# Patient Record
Sex: Female | Born: 1995
Health system: Southern US, Community
[De-identification: ages and names within clinical notes are randomized; demographics above are authoritative.]

## PROBLEM LIST (undated history)

## (undated) DIAGNOSIS — T7840XA Allergy, unspecified, initial encounter: Secondary | ICD-10-CM

## (undated) DIAGNOSIS — H6690 Otitis media, unspecified, unspecified ear: Secondary | ICD-10-CM

## (undated) DIAGNOSIS — K589 Irritable bowel syndrome without diarrhea: Secondary | ICD-10-CM

## (undated) DIAGNOSIS — G43909 Migraine, unspecified, not intractable, without status migrainosus: Secondary | ICD-10-CM

## (undated) HISTORY — DX: Irritable bowel syndrome, unspecified: K58.9

## (undated) HISTORY — DX: Allergy, unspecified, initial encounter: T78.40XA

## (undated) HISTORY — DX: Migraine, unspecified, not intractable, without status migrainosus: G43.909

## (undated) HISTORY — PX: TYMPANOTOMY: SHX2588

---
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
## 2016-01-13 ENCOUNTER — Encounter (HOSPITAL_COMMUNITY): Payer: Self-pay | Admitting: Emergency Medicine

## 2016-01-13 ENCOUNTER — Emergency Department (HOSPITAL_COMMUNITY)
Admission: EM | Admit: 2016-01-13 | Discharge: 2016-01-13 | Disposition: A | Discharge status: Home / Self Care | Payer: Worker's Compensation | Attending: Emergency Medicine | Admitting: Emergency Medicine

## 2016-01-13 ENCOUNTER — Emergency Department (HOSPITAL_COMMUNITY): Payer: Worker's Compensation

## 2016-01-13 DIAGNOSIS — X500XXA Overexertion from strenuous movement or load, initial encounter: Secondary | ICD-10-CM | POA: Diagnosis not present

## 2016-01-13 DIAGNOSIS — Y92129 Unspecified place in nursing home as the place of occurrence of the external cause: Secondary | ICD-10-CM | POA: Insufficient documentation

## 2016-01-13 DIAGNOSIS — Y9389 Activity, other specified: Secondary | ICD-10-CM | POA: Diagnosis not present

## 2016-01-13 DIAGNOSIS — Y990 Civilian activity done for income or pay: Secondary | ICD-10-CM | POA: Diagnosis not present

## 2016-01-13 DIAGNOSIS — M25511 Pain in right shoulder: Secondary | ICD-10-CM | POA: Insufficient documentation

## 2016-01-13 MED ORDER — NAPROXEN 500 MG PO TABS: 500.0000 mg | ORAL_TABLET | Freq: Two times a day (BID) | ORAL | 0 refills | Status: DC

## 2016-01-13 MED ORDER — NAPROXEN 250 MG PO TABS: 500.0000 mg | ORAL_TABLET | Freq: Once | ORAL | Status: DC

## 2016-01-13 MED ORDER — HYDROCODONE-ACETAMINOPHEN 5-325 MG PO TABS: 1.0000 | ORAL_TABLET | Freq: Once | ORAL | Status: DC

## 2016-01-13 MED ORDER — HYDROCODONE-ACETAMINOPHEN 5-325 MG PO TABS: 1.0000 | ORAL_TABLET | Freq: Four times a day (QID) | ORAL | 0 refills | Status: DC | PRN

## 2016-01-13 NOTE — ED Triage Notes (Addendum)
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  States she was moving pt over to bed and was lifting him from under his arms.  She states he fell down on bed and took her with him.  C/o burning pain to R shoulder since 10:30pm.  Ice applied at triage.

## 2016-01-13 NOTE — ED Notes (Signed)
Sling applied to right arm.

## 2016-01-13 NOTE — ED Provider Notes (Signed)
[facility]-EMERGENCY DEPT Provider Note   CSN: [id number] Arrival date & time: 01/13/16  0148  By signing my name below, I, Doreatha MartinEva Mathews, attest that this documentation has been prepared under the direction and in the presence of Shon Batonourtney F Tyde Lamison, MD. Electronically Signed: Doreatha MartinEva Mathews, ED Scribe. 01/13/16. 3:10 AM.     History   Chief Complaint Chief Complaint  Patient presents with  . Shoulder Pain    HPI Shannon Mccoy is a 20 y.o. female who presents to the Emergency Department complaining of moderate, throbbing, tingling, burning 9/10 right shoulder pain s/p injury that occurred at 10:30pm. 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 She denies LOC or head injury. Pt denies taking OTC medications at home to improve symptoms. She states her pain is worsened with movement. Pt takes daily Amitizia and align for IBS, but is otherwise healthy. She denies weakness in the right hand, additional injuries.   The history is provided by the patient. No language interpreter was used.    Past Medical History:  Diagnosis Date  . Allergy   . IBS (irritable bowel syndrome)   . Otitis media     Patient Active Problem List   Diagnosis Date Noted  . Seasonal allergies 10/30/2015  . IBS (irritable bowel syndrome) 10/30/2015    Past Surgical History:  Procedure Laterality Date  . TYMPANOTOMY      OB History    No data available       Home Medications    Prior to Admission medications   Medication Sig Start Date End Date Taking? Authorizing Provider  AMITIZA 24 MCG capsule Take 24 mcg by mouth 2 (two) times daily. 10/09/15   Historical Provider, MD  HYDROcodone-acetaminophen (NORCO/VICODIN) 5-325 MG tablet Take 1 tablet by mouth every 6 (six) hours as needed. 01/13/16   Shon Batonourtney F Sigfredo Schreier, MD  hyoscyamine (LEVSIN, ANASPAZ) 0.125 MG tablet Take 1 tablet by mouth daily as  needed. 10/23/15   Historical Provider, MD  naproxen (NAPROSYN) 500 MG tablet Take 1 tablet (500 mg total) by mouth 2 (two) times daily. 01/13/16   Shon Batonourtney F Lavene Penagos, MD  ondansetron (ZOFRAN) 4 MG tablet Take 1 tablet (4 mg total) by mouth every 6 (six) hours. 10/22/15   Bethel BornKelly Marie Gekas, PA-C  Probiotic Product (ALIGN PO) Take 1 capsule by mouth daily.    Historical Provider, MD    Family History Family History  Problem Relation Age of Onset  . Breast cancer Maternal Aunt   . Dementia Maternal Grandmother   . Hyperlipidemia Maternal Grandfather   . Kidney disease Maternal Grandfather     Social History Social History  Substance Use Topics  . Smoking status: Never Smoker  . Smokeless tobacco: Never Used  . Alcohol use No     Allergies   Fish allergy; Food; and Sulfa antibiotics   Review of Systems Review of Systems  Musculoskeletal: Positive for arthralgias.  Neurological: Negative for weakness and numbness.  All other systems reviewed and are negative.    Physical Exam Updated Vital Signs BP 125/79 (BP Location: Left Arm)   Pulse 71   Temp 97.5 F (36.4 C) (Oral)   Resp 18   LMP 12/30/2015   SpO2 99%   Physical Exam  Constitutional: She is oriented to person, place, and time. She appears well-developed and well-nourished. No distress.  HENT:  Head: Normocephalic  and atraumatic.  Cardiovascular: Normal rate, regular rhythm and normal heart sounds.   Pulmonary/Chest: Effort normal and breath sounds normal. No respiratory distress. She has no wheezes.  Musculoskeletal:  Normal range of motion of the right shoulder; however, slower secondary to pain, no obvious deformities of the clavicle or right upper extremity, intact grip strength, internal and external rotation strength, 2+ radial pulse, neurovascularly intact  Neurological: She is alert and oriented to person, place, and time.  Skin: Skin is warm and dry.  Psychiatric: She has a normal mood and affect.    Nursing note and vitals reviewed.    ED Treatments / Results   DIAGNOSTIC STUDIES: Oxygen Saturation is 99% on RA, normal by my interpretation.    COORDINATION OF CARE: 3:10 AM Discussed treatment plan with pt at bedside which includes XR and pt agreed to plan.    Labs (all labs ordered are listed, but only abnormal results are displayed) Labs Reviewed - No data to display  EKG  EKG Interpretation None       Radiology Dg Shoulder Right  Result Date: 01/13/2016 CLINICAL DATA:  Right shoulder pain after lifting patient at nursing home. EXAM: RIGHT SHOULDER - 2+ VIEW COMPARISON:  None. FINDINGS: There is no evidence of fracture or dislocation. There is no evidence of arthropathy or other focal bone abnormality. Soft tissues are unremarkable. IMPRESSION: No acute fracture or dislocation of the right shoulder. Electronically Signed   By: Deatra RobinsonKevin  Herman M.D.   On: 01/13/2016 02:22    Procedures Procedures (including critical care time)  Medications Ordered in ED Medications  HYDROcodone-acetaminophen (NORCO/VICODIN) 5-325 MG per tablet 1 tablet (not administered)  naproxen (NAPROSYN) tablet 500 mg (not administered)     Initial Impression / Assessment and Plan / ED Course  I have reviewed the triage vital signs and the nursing notes.  Pertinent labs & imaging results that were available during my care of the patient were reviewed by me and considered in my medical decision making (see chart for details).  Clinical Course     Patient presents following an injury at work. Reports heavy lifting and right shoulder pain. Neurovascular intact. X-ray negative. Suspect rotator cuff injury versus labral injury. Supportive measures. We'll provide a sling but have encouraged the patient to continue range of motion exercises to prevent decreased range of motion of the shoulder. No heavy lifting until cleared by orthopedics.  After history, exam, and medical workup I feel the patient  has been appropriately medically screened and is safe for discharge home. Pertinent diagnoses were discussed with the patient. Patient was given return precautions.   Final Clinical Impressions(s) / ED Diagnoses   Final diagnoses:  Acute pain of right shoulder    New Prescriptions New Prescriptions   HYDROCODONE-ACETAMINOPHEN (NORCO/VICODIN) 5-325 MG TABLET    Take 1 tablet by mouth every 6 (six) hours as needed.   NAPROXEN (NAPROSYN) 500 MG TABLET    Take 1 tablet (500 mg total) by mouth 2 (two) times daily.    I personally performed the services described in this documentation, which was scribed in my presence. The recorded information has been reviewed and is accurate.    Shon Batonourtney F Hilliary Jock, MD 01/13/16 [id number]

## 2016-01-13 NOTE — ED Notes (Signed)
Patient presents stating she was helping a coworker get a patient up and he fell backwards into the bed and hurt her right shoulder in the front area.  Stated it hurts to lift her arm up, moves her fingers well, +pulses.  Ice pack was given in the triage area.

## 2016-01-13 NOTE — Discharge Instructions (Signed)
You were seen today for shoulder pain. Your x-ray is reassuring. You may have sustained a rotator cuff injury or labral tear. Rest, ice, naproxen and a short course of Norco will be provided. You will also be given a sling. It is very important that you continue range of motion exercises as you tolerate in order for your shoulder not to freeze up. Follow-up with your orthopedist as soon as possible and for clearance for lifting at work.

## 2016-01-13 NOTE — ED Notes (Signed)
Discharge instructions and prescriptions reviewed - voiced understanding 

## 2016-01-14 DIAGNOSIS — S4991XA Unspecified injury of right shoulder and upper arm, initial encounter: Secondary | ICD-10-CM | POA: Diagnosis not present

## 2016-01-21 ENCOUNTER — Ambulatory Visit (INDEPENDENT_AMBULATORY_CARE_PROVIDER_SITE_OTHER): Payer: BLUE CROSS/BLUE SHIELD | Admitting: Internal Medicine

## 2016-01-21 ENCOUNTER — Encounter: Payer: Self-pay | Admitting: Internal Medicine

## 2016-01-21 DIAGNOSIS — Z0000 Encounter for general adult medical examination without abnormal findings: Secondary | ICD-10-CM

## 2016-01-21 LAB — LIPID PANEL
CHOL/HDL RATIO: 3
Cholesterol: 139 mg/dL (ref 0–200)
HDL: 42.2 mg/dL (ref >39.00)
LDL Cholesterol: 86 mg/dL (ref 0–99)
NONHDL: 96.76
TRIGLYCERIDES: 56 mg/dL (ref 0.0–149.0)
VLDL: 11.2 mg/dL (ref 0.0–40.0)

## 2016-01-21 LAB — COMPREHENSIVE METABOLIC PANEL
ALK PHOS: 70 U/L (ref 47–119)
ALT: 17 U/L (ref 0–35)
AST: 17 U/L (ref 0–37)
Albumin: 4.7 g/dL (ref 3.5–5.2)
BILIRUBIN TOTAL: 0.4 mg/dL (ref 0.2–1.2)
BUN: 11 mg/dL (ref 6–23)
CALCIUM: 9.7 mg/dL (ref 8.4–10.5)
CO2: 26 meq/L (ref 19–32)
CREATININE: 0.67 mg/dL (ref 0.40–1.20)
Chloride: 104 mEq/L (ref 96–112)
GFR: 120.06 mL/min (ref >60.00)
GLUCOSE: 93 mg/dL (ref 70–99)
Potassium: 3.7 mEq/L (ref 3.5–5.1)
Sodium: 139 mEq/L (ref 135–145)
TOTAL PROTEIN: 7.6 g/dL (ref 6.0–8.3)

## 2016-01-21 NOTE — Progress Notes (Signed)
Subjective:    Patient ID: Shannon Mccoy, female    DOB: 1995/08/25, 20 y.o.   MRN: [id number]  HPI  Pt presents to the clinic today for her annual exam.  Flu: 10/2015 Tetanus: 2009 Dentist: biannually  Diet: She does eat meat. She consumes fruits and vegetables daily. She does eat some fried foods. She drinks mostly water and powerade. Exercise: She goes to the gym, 3-7 days a week, 1 hour each time  Review of Systems  Past Medical History:  Diagnosis Date  . Allergy   . IBS (irritable bowel syndrome)   . Otitis media     Current Outpatient Prescriptions  Medication Sig Dispense Refill  . AMITIZA 24 MCG capsule Take 24 mcg by mouth 2 (two) times daily.  2   No current facility-administered medications for this visit.     Allergies  Allergen Reactions  . Fish Allergy Nausea And Vomiting  . Food Nausea And Vomiting    Coconut, fish, mushrooms  . Sulfa Antibiotics     Unknown reaction.     Family History  Problem Relation Age of Onset  . Breast cancer Maternal Aunt   . Dementia Maternal Grandmother   . Hyperlipidemia Maternal Grandfather   . Kidney disease Maternal Grandfather     Social History   Social History  . Marital status: Single    Spouse name: N/A  . Number of children: N/A  . Years of education: N/A   Occupational History  . Not on file.   Social History Main Topics  . Smoking status: Never Smoker  . Smokeless tobacco: Never Used  . Alcohol use No  . Drug use: No  . Sexual activity: Not on file   Other Topics Concern  . Not on file   Social History Narrative  . No narrative on file     Constitutional: Denies fever, malaise, fatigue, headache or abrupt weight changes.  HEENT: Denies eye pain, eye redness, ear pain, ringing in the ears, wax buildup, runny nose, nasal congestion, bloody nose, or sore throat. Respiratory: Denies difficulty breathing, shortness of breath, cough or sputum production.   Cardiovascular: Denies  chest pain, chest tightness, palpitations or swelling in the hands or feet.  Gastrointestinal: Pt reports intermittent diarrhea. Denies abdominal pain, bloating, constipation, or blood in the stool.  GU: Denies urgency, frequency, pain with urination, burning sensation, blood in urine, odor or discharge. Musculoskeletal: Denies decrease in range of motion, difficulty with gait, muscle pain or joint pain and swelling.  Skin: Denies redness, rashes, lesions or ulcercations.  Neurological: Denies dizziness, difficulty with memory, difficulty with speech or problems with balance and coordination.  Psych: Denies anxiety, depression, SI/HI.  No other specific complaints in a complete review of systems (except as listed in HPI above).     Objective:   Physical Exam   BP 120/76   Pulse 67   Temp 97.8 F (36.6 C) (Oral)   Ht 5' 4.75" (1.645 m)   Wt 154 lb (69.9 kg)   LMP 12/30/2015   SpO2 98%   BMI 25.83 kg/m  Wt Readings from Last 3 Encounters:  01/21/16 154 lb (69.9 kg) (84 %, Z= 1.00)*  12/10/15 153 lb 8 oz (69.6 kg) (84 %, Z= 1.00)*  10/30/15 155 lb 12 oz (70.6 kg) (86 %, Z= 1.07)*   * Growth percentiles are based on CDC 2-20 Years data.    General: Appears her stated age, well developed, well nourished in NAD. Skin: Warm, dry  and intact. HEENT: Head: normal shape and size; Eyes: sclera white, no icterus, conjunctiva pink, PERRLA and EOMs intact; Ears: Tm's gray and intact, normal light reflex; Throat/Mouth: Teeth present, mucosa pink and moist, no exudate, lesions or ulcerations noted.  Neck:  Neck supple, trachea midline. No masses, lumps or thyromegaly present.  Cardiovascular: Normal rate and rhythm. S1,S2 noted.  No murmur, rubs or gallops noted. No JVD or BLE edema. No carotid bruits noted. Pulmonary/Chest: Normal effort and positive vesicular breath sounds. No respiratory distress. No wheezes, rales or ronchi noted.  Abdomen: Soft and nontender. Normal bowel sounds. No  distention or masses noted. Liver, spleen and kidneys non palpable. Musculoskeletal: Normal range of motion. Strength 5/5 BUE.Ble. No difficulty with gait.  Neurological: Alert and oriented. Cranial nerves II-XII grossly intact. Coordination normal.  Psychiatric: Mood and affect normal. Behavior is normal. Judgment and thought content normal.     BMET    Component Value Date/Time   NA 136 10/22/2015 1528   K 3.6 10/22/2015 1528   CL 106 10/22/2015 1528   CO2 25 10/22/2015 1528   GLUCOSE 114 (H) 10/22/2015 1528   BUN 10 10/22/2015 1528   CREATININE 0.53 10/22/2015 1528   CALCIUM 9.4 10/22/2015 1528   GFRNONAA >60 10/22/2015 1528   GFRAA >60 10/22/2015 1528    Lipid Panel  No results found for: CHOL, TRIG, HDL, CHOLHDL, VLDL, LDLCALC  CBC    Component Value Date/Time   WBC 15.7 (H) 10/22/2015 1528   RBC 5.18 (H) 10/22/2015 1528   HGB 14.9 10/22/2015 1528   HCT 44.2 10/22/2015 1528   PLT 413 (H) 10/22/2015 1528   MCV 85.3 10/22/2015 1528   MCH 28.8 10/22/2015 1528   MCHC 33.7 10/22/2015 1528   RDW 12.5 10/22/2015 1528   LYMPHSABS 1.7 05/20/2010 1353   MONOABS 0.9 05/20/2010 1353   EOSABS 0.1 05/20/2010 1353   BASOSABS 0.0 05/20/2010 1353    Hgb A1C No results found for: HGBA1C         Assessment & Plan:   Preventative Health Maintenance:  Flu and tetanus UTD Discussed starting pap smears at age 89 Discussed STD screening- she declines Encouraged her to consume a balanced diet and exercise regimen Advised her to see a dentist at least annually Will check CBC, CMET and lipid profile today  RTC in 1 year, sooner if needed Nicki ReaperBAITY, Vernisha Bacote, NP

## 2016-01-21 NOTE — Patient Instructions (Signed)

## 2016-01-22 LAB — CBC
HCT: 41.1 % (ref 36.0–49.0)
Hemoglobin: 13.9 g/dL (ref 12.0–16.0)
MCHC: 33.8 g/dL (ref 31.0–37.0)
MCV: 85.6 fl (ref 78.0–98.0)
PLATELETS: 390 10*3/uL (ref 150.0–575.0)
RBC: 4.8 Mil/uL (ref 3.80–5.70)
RDW: 12.6 % (ref 11.4–15.5)
WBC: 10.5 10*3/uL (ref 4.5–13.5)

## 2016-02-11 ENCOUNTER — Other Ambulatory Visit: Payer: Self-pay | Admitting: Orthopedic Surgery

## 2016-02-11 DIAGNOSIS — R29898 Other symptoms and signs involving the musculoskeletal system: Secondary | ICD-10-CM

## 2016-02-11 DIAGNOSIS — S4991XD Unspecified injury of right shoulder and upper arm, subsequent encounter: Secondary | ICD-10-CM | POA: Diagnosis not present

## 2016-02-11 DIAGNOSIS — M25511 Pain in right shoulder: Secondary | ICD-10-CM

## 2016-02-11 DIAGNOSIS — M25311 Other instability, right shoulder: Secondary | ICD-10-CM | POA: Diagnosis not present

## 2016-02-12 DIAGNOSIS — K581 Irritable bowel syndrome with constipation: Secondary | ICD-10-CM | POA: Diagnosis not present

## 2016-03-02 ENCOUNTER — Ambulatory Visit: Payer: Self-pay | Admitting: Primary Care

## 2016-03-02 ENCOUNTER — Ambulatory Visit (INDEPENDENT_AMBULATORY_CARE_PROVIDER_SITE_OTHER): Payer: BLUE CROSS/BLUE SHIELD | Admitting: Family Medicine

## 2016-03-02 ENCOUNTER — Encounter: Payer: Self-pay | Admitting: Family Medicine

## 2016-03-02 VITALS — BP 104/70 | HR 68 | Temp 97.9°F | Ht 64.75 in | Wt 152.5 lb

## 2016-03-02 DIAGNOSIS — J069 Acute upper respiratory infection, unspecified: Secondary | ICD-10-CM | POA: Insufficient documentation

## 2016-03-02 DIAGNOSIS — J029 Acute pharyngitis, unspecified: Secondary | ICD-10-CM

## 2016-03-02 DIAGNOSIS — B9789 Other viral agents as the cause of diseases classified elsewhere: Secondary | ICD-10-CM | POA: Diagnosis not present

## 2016-03-02 LAB — POCT RAPID STREP A (OFFICE): RAPID STREP A SCREEN: NEGATIVE

## 2016-03-02 NOTE — Patient Instructions (Addendum)
I think you have a viral upper respiratory infection with cough that will take a while to get better  Re assuring the fever is better  Strep test is negative   Drink lots of fluids Gargle with salt water Chloraseptic throat spray and lozenges are helpful  Acetaminophen helps with sore throat and fever  Try mucinex DM for cough and congestion  Saline nasal spray or irrigation is ok for congestion   Update if not starting to improve in a week or if worsening   Especially if worse sore throat or return of fever     Upper Respiratory Infection, Adult Most upper respiratory infections (URIs) are a viral infection of the air passages leading to the lungs. A URI affects the nose, throat, and upper air passages. The most common type of URI is nasopharyngitis and is typically referred to as "the common cold." URIs run their course and usually go away on their own. Most of the time, a URI does not require medical attention, but sometimes a bacterial infection in the upper airways can follow a viral infection. This is called a secondary infection. Sinus and middle ear infections are common types of secondary upper respiratory infections. Bacterial pneumonia can also complicate a URI. A URI can worsen asthma and chronic obstructive pulmonary disease (COPD). Sometimes, these complications can require emergency medical care and may be life threatening. What are the causes? Almost all URIs are caused by viruses. A virus is a type of germ and can spread from one person to another. What increases the risk? You may be at risk for a URI if:  You smoke.  You have chronic heart or lung disease.  You have a weakened defense (immune) system.  You are very young or very old.  You have nasal allergies or asthma.  You work in crowded or poorly ventilated areas.  You work in health care facilities or schools. What are the signs or symptoms? Symptoms typically develop 2-3 days after you come in contact  with a cold virus. Most viral URIs last 7-10 days. However, viral URIs from the influenza virus (flu virus) can last 14-18 days and are typically more severe. Symptoms may include:  Runny or stuffy (congested) nose.  Sneezing.  Cough.  Sore throat.  Headache.  Fatigue.  Fever.  Loss of appetite.  Pain in your forehead, behind your eyes, and over your cheekbones (sinus pain).  Muscle aches. How is this diagnosed? Your health care provider may diagnose a URI by:  Physical exam.  Tests to check that your symptoms are not due to another condition such as:  Strep throat.  Sinusitis.  Pneumonia.  Asthma. How is this treated? A URI goes away on its own with time. It cannot be cured with medicines, but medicines may be prescribed or recommended to relieve symptoms. Medicines may help:  Reduce your fever.  Reduce your cough.  Relieve nasal congestion. Follow these instructions at home:  Take medicines only as directed by your health care provider.  Gargle warm saltwater or take cough drops to comfort your throat as directed by your health care provider.  Use a warm mist humidifier or inhale steam from a shower to increase air moisture. This may make it easier to breathe.  Drink enough fluid to keep your urine clear or pale yellow.  Eat soups and other clear broths and maintain good nutrition.  Rest as needed.  Return to work when your temperature has returned to normal or as your health  care provider advises. You may need to stay home longer to avoid infecting others. You can also use a face mask and careful hand washing to prevent spread of the virus.  Increase the usage of your inhaler if you have asthma.  Do not use any tobacco products, including cigarettes, chewing tobacco, or electronic cigarettes. If you need help quitting, ask your health care provider. How is this prevented? The best way to protect yourself from getting a cold is to practice good  hygiene.  Avoid oral or hand contact with people with cold symptoms.  Wash your hands often if contact occurs. There is no clear evidence that vitamin C, vitamin E, echinacea, or exercise reduces the chance of developing a cold. However, it is always recommended to get plenty of rest, exercise, and practice good nutrition. Contact a health care provider if:  You are getting worse rather than better.  Your symptoms are not controlled by medicine.  You have chills.  You have worsening shortness of breath.  You have brown or red mucus.  You have yellow or brown nasal discharge.  You have pain in your face, especially when you bend forward.  You have a fever.  You have swollen neck glands.  You have pain while swallowing.  You have white areas in the back of your throat. Get help right away if:  You have severe or persistent:  Headache.  Ear pain.  Sinus pain.  Chest pain.  You have chronic lung disease and any of the following:  Wheezing.  Prolonged cough.  Coughing up blood.  A change in your usual mucus.  You have a stiff neck.  You have changes in your:  Vision.  Hearing.  Thinking.  Mood. This information is not intended to replace advice given to you by your health care provider. Make sure you discuss any questions you have with your health care provider. Document Released: 08/05/2000 Document Revised: 10/13/2015 Document Reviewed: 05/17/2013 Elsevier Interactive Patient Education  2017 [employer]

## 2016-03-02 NOTE — Assessment & Plan Note (Signed)
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

## 2016-03-02 NOTE — Progress Notes (Signed)
Subjective:    Patient ID: Shannon Mccoy, female    DOB: 03/16/1995, 21 y.o.   MRN: [id number]  HPI Here with fever and ST  Started getting sick last Thursday  Called out sat night  Worse since then   Temp was up on sat night - unsure how high/ low grade she things  Felt hot and cold  Not achey    Results for orders placed or performed in visit on 03/02/16  Rapid Strep A  Result Value Ref Range   Rapid Strep A Screen Negative Negative    Very sore to swallow Not swollen  Dry mouth and throat   Congested and prod cough  ? If color to drainage  No wheezing or trouble breathing   Ears feel fine   Over the counter- took cold and flu liquid multi system  Helps a little   Works in a nursing home  Had her flu shot   Patient Active Problem List   Diagnosis Date Noted  . Seasonal allergies 10/30/2015  . IBS (irritable bowel syndrome) 10/30/2015   Past Medical History:  Diagnosis Date  . Allergy   . IBS (irritable bowel syndrome)   . Otitis media    Past Surgical History:  Procedure Laterality Date  . TYMPANOTOMY     Social History  Substance Use Topics  . Smoking status: Never Smoker  . Smokeless tobacco: Never Used  . Alcohol use No   Family History  Problem Relation Age of Onset  . Breast cancer Maternal Aunt   . Dementia Maternal Grandmother   . Hyperlipidemia Maternal Grandfather   . Kidney disease Maternal Grandfather    Allergies  Allergen Reactions  . Fish Allergy Nausea And Vomiting  . Food Nausea And Vomiting    Coconut, fish, mushrooms  . Sulfa Antibiotics     Unknown reaction.    Current Outpatient Prescriptions on File Prior to Visit  Medication Sig Dispense Refill  . AMITIZA 24 MCG capsule Take 24 mcg by mouth 2 (two) times daily.  2   No current facility-administered medications on file prior to visit.      Review of Systems  Constitutional: Positive for appetite change and fatigue. Negative for fever.  HENT: Positive  for congestion, postnasal drip, rhinorrhea, sinus pressure, sneezing and sore throat. Negative for ear pain.   Eyes: Negative for pain and discharge.  Respiratory: Positive for cough. Negative for shortness of breath, wheezing and stridor.   Cardiovascular: Negative for chest pain.  Gastrointestinal: Negative for diarrhea, nausea and vomiting.  Genitourinary: Negative for frequency, hematuria and urgency.  Musculoskeletal: Negative for arthralgias and myalgias.  Skin: Negative for rash.  Neurological: Positive for headaches. Negative for dizziness, weakness and light-headedness.  Psychiatric/Behavioral: Negative for confusion and dysphoric mood.       Objective:   Physical Exam  Constitutional: She appears well-developed and well-nourished. No distress.  Well but fatigued appearing   HENT:  Head: Normocephalic and atraumatic.  Right Ear: External ear normal.  Left Ear: External ear normal.  Mouth/Throat: Oropharynx is clear and moist.  Nares are injected and congested  No sinus tenderness Clear rhinorrhea and post nasal drip   Eyes: Conjunctivae and EOM are normal. Pupils are equal, round, and reactive to light. Right eye exhibits no discharge. Left eye exhibits no discharge.  Neck: Normal range of motion. Neck supple.  Cardiovascular: Normal rate and normal heart sounds.   Pulmonary/Chest: Effort normal and breath sounds normal. No respiratory distress. She  has no wheezes. She has no rales. She exhibits no tenderness.  Lymphadenopathy:    She has no cervical adenopathy.  Neurological: She is alert.  Skin: Skin is warm and dry. No rash noted.  Psychiatric: She has a normal mood and affect.          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

## 2016-03-02 NOTE — Progress Notes (Signed)
Pre visit review using our clinic review tool, if applicable. No additional management support is needed unless otherwise documented below in the visit note. 

## 2016-03-04 ENCOUNTER — Ambulatory Visit (INDEPENDENT_AMBULATORY_CARE_PROVIDER_SITE_OTHER): Payer: BLUE CROSS/BLUE SHIELD | Admitting: Primary Care

## 2016-03-04 ENCOUNTER — Encounter: Payer: Self-pay | Admitting: Primary Care

## 2016-03-04 VITALS — BP 108/74 | HR 73 | Temp 97.4°F | Ht 64.75 in | Wt 150.1 lb

## 2016-03-04 DIAGNOSIS — R112 Nausea with vomiting, unspecified: Secondary | ICD-10-CM | POA: Diagnosis not present

## 2016-03-04 MED ORDER — ONDANSETRON 4 MG PO TBDP: 4.0000 mg | ORAL_TABLET | Freq: Three times a day (TID) | ORAL | 0 refills | Status: DC | PRN

## 2016-03-04 NOTE — Patient Instructions (Signed)
Your symptoms are representative of a viral illness which will resolve on its own over time. Our goal is to treat your symptoms in order to aid your body in the healing process and to make you more comfortable.   You may take Zofran tablets every 8 hours. Melt one tablet in your mouth every 8 hours as need for nausea.   Gradually increase intake of food as tolerated. Continue to stay hydrated with water.  Cough/Congestion: Try taking Mucinex DM. This will help loosen up the mucous in your chest. Ensure you take this medication with a full glass of water.  Please call me Friday if you're feeling worse.  It was a pleasure meeting you!

## 2016-03-04 NOTE — Progress Notes (Signed)
   Subjective:    Patient ID: Shannon Mccoy, female    DOB: 1995-03-23, 21 y.o.   MRN: [id number]  HPI  Mr. Shannon Mccoy is a 21 year old female with a history of allergic rhinitis who presents today with a chief complaint of cough. She also reports fever, headaches, vomiting.   She was evaluated on 03/02/16 for a 4 day history or URI symptoms, and diagnosed with a viral URI. She was provided with supportive measures and told to follow up if no improvement. Her rapid strep was negative.  Since her visit Monday she continues to have fevers, vomiting, sore throat, headaches. She denies body aches, abdominal pain, shortness of breath. She cannot remember what her fevers are running. She's taken Mucinex, cold and flu medication OTC with temporary improvement.  Review of Systems  Constitutional: Positive for fatigue and fever. Negative for appetite change.  HENT: Positive for congestion and sore throat.   Respiratory: Positive for cough.   Gastrointestinal: Positive for nausea and vomiting. Negative for abdominal pain and diarrhea.       Objective:   Physical Exam  Constitutional: She appears well-nourished. She does not appear ill.  Neck: Neck supple.  Cardiovascular: Normal rate and regular rhythm.   Pulmonary/Chest: Effort normal and breath sounds normal.  Abdominal: Soft. Bowel sounds are normal. There is no tenderness.  Skin: Skin is warm and dry.          Assessment & Plan:  URI vs Viral Gastroenteritis:  Cough, headaches, nausea with vomiting x 6-7 days. Overall feeling about the same. Exam today stable. She does appear acutely ill, but not sickly. Abdominal and respiratory exam unremarkable. Suspect viral symptoms and will treat with supportive measures. Rx for Zofran provided for nausea, discussed hydration and to advance diet as tolerated. Work note provided to return Saturday, she will notify if she's worse/no better. She is stable for outpatient  treatment.  [name],[name], NP

## 2016-03-04 NOTE — Progress Notes (Signed)
Pre visit review using our clinic review tool, if applicable. No additional management support is needed unless otherwise documented below in the visit note. 

## 2016-05-21 ENCOUNTER — Ambulatory Visit (INDEPENDENT_AMBULATORY_CARE_PROVIDER_SITE_OTHER): Payer: BLUE CROSS/BLUE SHIELD | Admitting: Internal Medicine

## 2016-05-21 ENCOUNTER — Encounter: Payer: Self-pay | Admitting: Internal Medicine

## 2016-05-21 VITALS — BP 108/70 | HR 82 | Temp 98.0°F | Wt 159.5 lb

## 2016-05-21 DIAGNOSIS — J301 Allergic rhinitis due to pollen: Secondary | ICD-10-CM

## 2016-05-21 NOTE — Progress Notes (Signed)
HPI  Pt presents to the clinic today with c/o runny nose, ear pain, sore throat and cough. This started 2 days ago. She is blowing clear mucous out of her nose. She denies ear pain or decreased hearing. She denies difficulty swallowing. The cough is non productive. She denies fever but has had chills. She has not tried anything OTC. She has a history of allergies and she has possibly had sick contacts at work. Her flu shot is UTD.  Review of Systems        Past Medical History:  Diagnosis Date  . Allergy   . IBS (irritable bowel syndrome)   . Otitis media     Family History  Problem Relation Age of Onset  . Breast cancer Maternal Aunt   . Dementia Maternal Grandmother   . Hyperlipidemia Maternal Grandfather   . Kidney disease Maternal Grandfather     Social History   Social History  . Marital status: Single    Spouse name: N/A  . Number of children: N/A  . Years of education: N/A   Occupational History  . Not on file.   Social History Main Topics  . Smoking status: Never Smoker  . Smokeless tobacco: Never Used  . Alcohol use No  . Drug use: No  . Sexual activity: Not on file   Other Topics Concern  . Not on file   Social History Narrative  . No narrative on file    Allergies  Allergen Reactions  . Fish Allergy Nausea And Vomiting  . Food Nausea And Vomiting    Coconut, fish, mushrooms  . Sulfa Antibiotics     Unknown reaction.      Constitutional: Denies headache, fatigue, fever or abrupt weight changes.  HEENT:  Positive runny nose, ear pain, sore throat. Denies eye redness, eye pain, pressure behind the eyes, facial pain, nasal congestion, ringing in the ears, wax buildup, or bloody nose. Respiratory: Positive cough. Denies difficulty breathing or shortness of breath.  Cardiovascular: Denies chest pain, chest tightness, palpitations or swelling in the hands or feet.   No other specific complaints in a complete review of systems (except as listed in HPI  above).  Objective:   BP 108/70   Pulse 82   Temp 98 F (36.7 C) (Oral)   Wt 159 lb 8 oz (72.3 kg)   LMP 05/21/2016   SpO2 99%   BMI 26.75 kg/m   Wt Readings from Last 3 Encounters:  05/21/16 159 lb 8 oz (72.3 kg) (87 %, Z= 1.13)*  03/04/16 150 lb 1.9 oz (68.1 kg) (81 %, Z= 0.87)*  03/02/16 152 lb 8 oz (69.2 kg) (83 %, Z= 0.95)*   * Growth percentiles are based on CDC 2-20 Years data.     General: Appears her stated age, in NAD. HEENT: Head: normal shape and size, no sinus tenderness; Ears: Tm's gray and intact, normal light reflex; Nose: mucosa pink and moist, septum midline; Throat/Mouth: + PND. Teeth present, mucosa pink and moist, no exudate noted, no lesions or ulcerations noted.  Neck: No cervical lymphadenopathy.  Cardiovascular: Normal rate and rhythm. S1,S2 noted.  No murmur, rubs or gallops noted.  Pulmonary/Chest: Normal effort and positive vesicular breath sounds. No respiratory distress. No wheezes, rales or ronchi noted.       Assessment & Plan:   Allergic Rhinitis:  Get some rest and drink plenty of water Do salt water gargles for the sore throat Start Zyrtec and Flonase OTC Work note provided  RTC  as needed or if symptoms persist.   Webb Silversmith, NP

## 2016-05-21 NOTE — Patient Instructions (Signed)
Allergic Rhinitis Allergic rhinitis is when the mucous membranes in the nose respond to allergens. Allergens are particles in the air that cause your body to have an allergic reaction. This causes you to release allergic antibodies. Through a chain of events, these eventually cause you to release histamine into the blood stream. Although meant to protect the body, it is this release of histamine that causes your discomfort, such as frequent sneezing, congestion, and an itchy, runny nose. What are the causes? Seasonal allergic rhinitis (hay fever) is caused by pollen allergens that may come from grasses, trees, and weeds. Year-round allergic rhinitis (perennial allergic rhinitis) is caused by allergens such as house dust mites, pet dander, and mold spores. What are the signs or symptoms?  Nasal stuffiness (congestion).  Itchy, runny nose with sneezing and tearing of the eyes. How is this diagnosed? Your health care provider can help you determine the allergen or allergens that trigger your symptoms. If you and your health care provider are unable to determine the allergen, skin or blood testing may be used. Your health care provider will diagnose your condition after taking your health history and performing a physical exam. Your health care provider may assess you for other related conditions, such as asthma, pink eye, or an ear infection. How is this treated? Allergic rhinitis does not have a cure, but it can be controlled by:  Medicines that block allergy symptoms. These may include allergy shots, nasal sprays, and oral antihistamines.  Avoiding the allergen. Hay fever may often be treated with antihistamines in pill or nasal spray forms. Antihistamines block the effects of histamine. There are over-the-counter medicines that may help with nasal congestion and swelling around the eyes. Check with your health care provider before taking or giving this medicine. If avoiding the allergen or the  medicine prescribed do not work, there are many new medicines your health care provider can prescribe. Stronger medicine may be used if initial measures are ineffective. Desensitizing injections can be used if medicine and avoidance does not work. Desensitization is when a patient is given ongoing shots until the body becomes less sensitive to the allergen. Make sure you follow up with your health care provider if problems continue. Follow these instructions at home: It is not possible to completely avoid allergens, but you can reduce your symptoms by taking steps to limit your exposure to them. It helps to know exactly what you are allergic to so that you can avoid your specific triggers. Contact a health care provider if:  You have a fever.  You develop a cough that does not stop easily (persistent).  You have shortness of breath.  You start wheezing.  Symptoms interfere with normal daily activities. This information is not intended to replace advice given to you by your health care provider. Make sure you discuss any questions you have with your health care provider. Document Released: 11/04/2000 Document Revised: 10/11/2015 Document Reviewed: 10/17/2012 Elsevier Interactive Patient Education  2017 [employer] Inc[employer]  

## 2016-06-18 DIAGNOSIS — L858 Other specified epidermal thickening: Secondary | ICD-10-CM | POA: Diagnosis not present

## 2016-06-18 DIAGNOSIS — B078 Other viral warts: Secondary | ICD-10-CM | POA: Diagnosis not present

## 2016-07-09 DIAGNOSIS — B078 Other viral warts: Secondary | ICD-10-CM | POA: Diagnosis not present

## 2016-08-04 ENCOUNTER — Ambulatory Visit (INDEPENDENT_AMBULATORY_CARE_PROVIDER_SITE_OTHER): Payer: BLUE CROSS/BLUE SHIELD | Admitting: Internal Medicine

## 2016-08-04 ENCOUNTER — Encounter: Payer: Self-pay | Admitting: Internal Medicine

## 2016-08-04 VITALS — BP 108/68 | HR 74 | Temp 97.9°F | Wt 153.0 lb

## 2016-08-04 DIAGNOSIS — T63461A Toxic effect of venom of wasps, accidental (unintentional), initial encounter: Secondary | ICD-10-CM

## 2016-08-04 NOTE — Patient Instructions (Signed)
Insect Bite, Adult An insect bite can make your skin red, itchy, and swollen. Some insects can spread disease to people with a bite. However, most insect bites do not lead to disease, and most are not serious. Follow these instructions at home: Bite area care  Do not scratch the bite area.  Keep the bite area clean and dry.  Wash the bite area every day with soap and water as told by your doctor.  Check the bite area every day for signs of infection. Check for: ? More redness, swelling, or pain. ? Fluid or blood. ? Warmth. ? Pus. Managing pain, itching, and swelling  You may put any of these on the bite area as told by your doctor: ? A baking soda paste. ? Cortisone cream. ? Calamine lotion.  If directed, put ice on the bite area. ? Put ice in a plastic bag. ? Place a towel between your skin and the bag. ? Leave the ice on for 20 minutes, 2-3 times a day. Medicines  Take medicines or put medicines on your skin only as told by your doctor.  If you were prescribed an antibiotic medicine, use it as told by your doctor. Do not stop using the antibiotic even if your condition improves. General instructions  Keep all follow-up visits as told by your doctor. This is important. How is this prevented? To help you have a lower risk of insect bites:  When you are outside, wear clothing that covers your arms and legs.  Use insect repellent. The best insect repellents have: ? An active ingredient of DEET, picaridin, oil of lemon eucalyptus (OLE), or IR3535. ? Higher amounts of DEET or another active ingredient than other repellents have.  If your home windows do not have screens, think about putting some in.  Contact a doctor if:  You have more redness, swelling, or pain in the bite area.  You have fluid, blood, or pus coming from the bite area.  The bite area feels warm.  You have a fever. Get help right away if:  You have joint pain.  You have a rash.  You have  shortness of breath.  You feel more tired or sleepy than you normally do.  You have neck pain.  You have a headache.  You feel weaker than you normally do.  You have chest pain.  You have pain in your belly.  You feel sick to your stomach (nauseous) or you throw up (vomit). Summary  An insect bite can make your skin red, itchy, and swollen.  Do not scratch the bite area, and keep it clean and dry.  Ice can help with pain and itching from the bite. This information is not intended to replace advice given to you by your health care provider. Make sure you discuss any questions you have with your health care provider. Document Released: 02/07/2000 Document Revised: 09/12/2015 Document Reviewed: 06/27/2014 Elsevier Interactive Patient Education  2018 [employer] Inc.  

## 2016-08-04 NOTE — Progress Notes (Signed)
Subjective:    Patient ID: Shannon Mccoy, female    DOB: [birth year], 21 y.o.   MRN: [id number]  HPI  Pt presents to the clinic today with c/o wasp sting to her hand. This occurred 3-4 days ago. She reports the area is swollen, itches and burns but she has not noticed any redness or warmth. She has tried Bendaryl with minimal relief.    Review of Systems      Past Medical History:  Diagnosis Date  . Allergy   . IBS (irritable bowel syndrome)   . Otitis media     Current Outpatient Prescriptions  Medication Sig Dispense Refill  . AMITIZA 24 MCG capsule Take 24 mcg by mouth 2 (two) times daily.  2  . ondansetron (ZOFRAN ODT) 4 MG disintegrating tablet Take 1 tablet (4 mg total) by mouth every 8 (eight) hours as needed for nausea or vomiting. 20 tablet 0   No current facility-administered medications for this visit.     Allergies  Allergen Reactions  . Fish Allergy Nausea And Vomiting  . Food Nausea And Vomiting    Coconut, fish, mushrooms  . Sulfa Antibiotics     Unknown reaction.     Family History  Problem Relation Age of Onset  . Breast cancer Maternal Aunt   . Dementia Maternal Grandmother   . Hyperlipidemia Maternal Grandfather   . Kidney disease Maternal Grandfather     Social History   Social History  . Marital status: Single    Spouse name: N/A  . Number of children: N/A  . Years of education: N/A   Occupational History  . Not on file.   Social History Main Topics  . Smoking status: Never Smoker  . Smokeless tobacco: Never Used  . Alcohol use No  . Drug use: No  . Sexual activity: Not on file   Other Topics Concern  . Not on file   Social History Narrative  . No narrative on file     Constitutional: Denies fever, malaise, fatigue, headache or abrupt weight changes.  Musculoskeletal: Pt reports hand swelling. Denies decrease in range of motion, difficulty with gait, muscle pain or joint pain.  Skin: Pt reports wasp sting. Denies  redness, rashes, lesions or ulcercations.    No other specific complaints in a complete review of systems (except as listed in HPI above).  Objective:   Physical Exam   BP 108/68   Pulse 74   Temp 97.9 F (36.6 C) (Oral)   Wt 153 lb (69.4 kg)   LMP 07/21/2016   SpO2 98%   BMI 25.66 kg/m  Wt Readings from Last 3 Encounters:  08/04/16 153 lb (69.4 kg) (82 %, Z= 0.93)*  05/21/16 159 lb 8 oz (72.3 kg) (87 %, Z= 1.13)*  03/04/16 150 lb 1.9 oz (68.1 kg) (81 %, Z= 0.87)*   * Growth percentiles are based on CDC 2-20 Years data.    General: Appears her stated age, well developed, well nourished in NAD. Skin: No redness, warmth noted. Musculoskeletal: Trace swelling noted of left hand.   BMET    Component Value Date/Time   NA 139 01/21/2016 1453   K 3.7 01/21/2016 1453   CL 104 01/21/2016 1453   CO2 26 01/21/2016 1453   GLUCOSE 93 01/21/2016 1453   BUN 11 01/21/2016 1453   CREATININE 0.67 01/21/2016 1453   CALCIUM 9.7 01/21/2016 1453   GFRNONAA >60 10/22/2015 1528   GFRAA >60 10/22/2015 1528    Lipid  Panel     Component Value Date/Time   CHOL 139 01/21/2016 1453   TRIG 56.0 01/21/2016 1453   HDL 42.20 01/21/2016 1453   CHOLHDL 3 01/21/2016 1453   VLDL 11.2 01/21/2016 1453   LDLCALC 86 01/21/2016 1453    CBC    Component Value Date/Time   WBC 10.5 01/21/2016 1453   RBC 4.80 01/21/2016 1453   HGB 13.9 01/21/2016 1453   HCT 41.1 01/21/2016 1453   PLT 390.0 01/21/2016 1453   MCV 85.6 01/21/2016 1453   MCH 28.8 10/22/2015 1528   MCHC 33.8 01/21/2016 1453   RDW 12.6 01/21/2016 1453   LYMPHSABS 1.7 05/20/2010 1353   MONOABS 0.9 05/20/2010 1353   EOSABS 0.1 05/20/2010 1353   BASOSABS 0.0 05/20/2010 1353    Hgb A1C No results found for: HGBA1C         Assessment & Plan:   Wasp Sting, Left Hand:  No s/s of infection 80 mg Depo IM today Continue Benadryl as needed Elevation would be helpful  RTC as needed or if symptoms persist or worsen Klaudia Beirne,  Alanny Rivers, NP

## 2016-08-04 NOTE — Progress Notes (Signed)
I reviewed  note, was available for consultation, and agree with documentation and plan.  

## 2016-08-06 MED ORDER — METHYLPREDNISOLONE ACETATE 80 MG/ML IJ SUSP
80.0000 mg | Freq: Once | INTRAMUSCULAR | Status: AC
  Administered 2016-08-04: 80 mg via INTRAMUSCULAR

## 2016-08-06 NOTE — Addendum Note (Signed)
Addended by: Roena MaladyEVONTENNO, Biance Moncrief Y on: 08/06/2016 11:28 AM   Modules accepted: Orders

## 2016-08-31 DIAGNOSIS — K581 Irritable bowel syndrome with constipation: Secondary | ICD-10-CM | POA: Diagnosis not present

## 2016-08-31 DIAGNOSIS — K5900 Constipation, unspecified: Secondary | ICD-10-CM | POA: Diagnosis not present

## 2016-09-16 DIAGNOSIS — B078 Other viral warts: Secondary | ICD-10-CM | POA: Diagnosis not present

## 2016-12-10 DIAGNOSIS — K5909 Other constipation: Secondary | ICD-10-CM | POA: Insufficient documentation

## 2016-12-17 DIAGNOSIS — B078 Other viral warts: Secondary | ICD-10-CM | POA: Diagnosis not present

## 2017-01-08 DIAGNOSIS — K5900 Constipation, unspecified: Secondary | ICD-10-CM | POA: Diagnosis not present

## 2017-01-08 DIAGNOSIS — K580 Irritable bowel syndrome with diarrhea: Secondary | ICD-10-CM | POA: Diagnosis not present

## 2017-01-08 DIAGNOSIS — R140 Abdominal distension (gaseous): Secondary | ICD-10-CM | POA: Diagnosis not present

## 2017-01-08 DIAGNOSIS — R197 Diarrhea, unspecified: Secondary | ICD-10-CM | POA: Diagnosis not present

## 2017-01-08 DIAGNOSIS — K6289 Other specified diseases of anus and rectum: Secondary | ICD-10-CM | POA: Diagnosis not present

## 2017-01-22 ENCOUNTER — Encounter: Payer: Self-pay | Admitting: Internal Medicine

## 2017-02-10 ENCOUNTER — Encounter: Payer: Self-pay | Admitting: Internal Medicine

## 2017-02-10 ENCOUNTER — Ambulatory Visit: Payer: BLUE CROSS/BLUE SHIELD | Admitting: Internal Medicine

## 2017-02-10 VITALS — BP 110/76 | HR 75 | Temp 98.0°F | Wt 159.0 lb

## 2017-02-10 DIAGNOSIS — G44311 Acute post-traumatic headache, intractable: Secondary | ICD-10-CM | POA: Diagnosis not present

## 2017-02-10 NOTE — Patient Instructions (Signed)

## 2017-02-11 ENCOUNTER — Ambulatory Visit: Payer: Self-pay | Admitting: Internal Medicine

## 2017-02-12 ENCOUNTER — Encounter: Payer: Self-pay | Admitting: Internal Medicine

## 2017-02-12 NOTE — Progress Notes (Signed)
Subjective:    Patient ID: Shannon Mccoy, female    DOB: 06-22-1995, 21 y.o.   MRN: [id number]  HPI  Pt presents to the clinic today with c/o a headache. She reports that 3-4 days ago, she hit her head, just above her right eyebrow, on a door at work. There was some redness and swelling but no bruising. She did not lose consciousness. The headache was originally located in the left frontal region. She describes the pain as pressure and achy. The headache has since moved into the right forehead. She denies dizziness, confusion, sensitivity to light or sound or visual changes. She has taken Benadryl at night to help her sleep.  Review of Systems  Past Medical History:  Diagnosis Date  . Allergy   . IBS (irritable bowel syndrome)   . Otitis media     Current Outpatient Medications  Medication Sig Dispense Refill  . AMITIZA 24 MCG capsule Take 24 mcg by mouth 2 (two) times daily.  2  . ondansetron (ZOFRAN ODT) 4 MG disintegrating tablet Take 1 tablet (4 mg total) by mouth every 8 (eight) hours as needed for nausea or vomiting. 20 tablet 0   No current facility-administered medications for this visit.     Allergies  Allergen Reactions  . Fish Allergy Nausea And Vomiting  . Food Nausea And Vomiting    Coconut, fish, mushrooms  . Sulfa Antibiotics     Unknown reaction.     Family History  Problem Relation Age of Onset  . Breast cancer Maternal Aunt   . Dementia Maternal Grandmother   . Hyperlipidemia Maternal Grandfather   . Kidney disease Maternal Grandfather     Social History   Socioeconomic History  . Marital status: Single    Spouse name: Not on file  . Number of children: Not on file  . Years of education: Not on file  . Highest education level: Not on file  Social Needs  . Financial resource strain: Not on file  . Food insecurity - worry: Not on file  . Food insecurity - inability: Not on file  . Transportation needs - medical: Not on file  .  Transportation needs - non-medical: Not on file  Occupational History  . Not on file  Tobacco Use  . Smoking status: Never Smoker  . Smokeless tobacco: Never Used  Substance and Sexual Activity  . Alcohol use: No  . Drug use: No  . Sexual activity: Not on file  Other Topics Concern  . Not on file  Social History Narrative  . Not on file     Constitutional: Pt reports headache. Denies fever, malaise, fatigue,  or abrupt weight changes.  HEENT: Denies eye pain, eye redness, ear pain, ringing in the ears, wax buildup, runny nose, nasal congestion, bloody nose, or sore throat. Neurological: Denies dizziness, difficulty with memory, difficulty with speech or problems with balance and coordination.    No other specific complaints in a complete review of systems (except as listed in HPI above).     Objective:   Physical Exam   BP 110/76   Pulse 75   Temp 98 F (36.7 C) (Oral)   Wt 159 lb (72.1 kg)   LMP 01/19/2017   SpO2 98%   BMI 26.66 kg/m  Wt Readings from Last 3 Encounters:  02/10/17 159 lb (72.1 kg)  08/04/16 153 lb (69.4 kg) (82 %, Z= 0.93)*  05/21/16 159 lb 8 oz (72.3 kg) (87 %, Z= 1.13)*   *  Growth percentiles are based on CDC (Girls, 2-20 Years) data.    General: Appears her stated age, in NAD. HEENT: Eyes: PERRLA and EOMs intact;  Musculoskeletal: No obvious deformity of the skull, just above the right eyebrow. Neurological: Alert and oriented. Cranial nerves II-XII grossly intact. Coordination normal.    BMET    Component Value Date/Time   NA 139 01/21/2016 1453   K 3.7 01/21/2016 1453   CL 104 01/21/2016 1453   CO2 26 01/21/2016 1453   GLUCOSE 93 01/21/2016 1453   BUN 11 01/21/2016 1453   CREATININE 0.67 01/21/2016 1453   CALCIUM 9.7 01/21/2016 1453   GFRNONAA >60 10/22/2015 1528   GFRAA >60 10/22/2015 1528    Lipid Panel     Component Value Date/Time   CHOL 139 01/21/2016 1453   TRIG 56.0 01/21/2016 1453   HDL 42.20 01/21/2016 1453    CHOLHDL 3 01/21/2016 1453   VLDL 11.2 01/21/2016 1453   LDLCALC 86 01/21/2016 1453    CBC    Component Value Date/Time   WBC 10.5 01/21/2016 1453   RBC 4.80 01/21/2016 1453   HGB 13.9 01/21/2016 1453   HCT 41.1 01/21/2016 1453   PLT 390.0 01/21/2016 1453   MCV 85.6 01/21/2016 1453   MCH 28.8 10/22/2015 1528   MCHC 33.8 01/21/2016 1453   RDW 12.6 01/21/2016 1453   LYMPHSABS 1.7 05/20/2010 1353   MONOABS 0.9 05/20/2010 1353   EOSABS 0.1 05/20/2010 1353   BASOSABS 0.0 05/20/2010 1353    Hgb A1C No results found for: HGBA1C         Assessment & Plan:   Headache, s/p Minor Head Trauma:  No evidence of skull fracture or concussion Advised her to try Ibuprofen 600 mg TID prn Offered Toradol injection but she declined  Return precautions discussed Nicki ReaperBAITY, Maryelizabeth Eberle, NP

## 2017-03-02 ENCOUNTER — Ambulatory Visit (INDEPENDENT_AMBULATORY_CARE_PROVIDER_SITE_OTHER): Payer: BLUE CROSS/BLUE SHIELD | Admitting: Internal Medicine

## 2017-03-02 ENCOUNTER — Encounter: Payer: Self-pay | Admitting: Internal Medicine

## 2017-03-02 VITALS — BP 110/76 | HR 78 | Temp 98.2°F | Ht 64.75 in | Wt 161.0 lb

## 2017-03-02 DIAGNOSIS — K581 Irritable bowel syndrome with constipation: Secondary | ICD-10-CM

## 2017-03-02 DIAGNOSIS — Z0000 Encounter for general adult medical examination without abnormal findings: Secondary | ICD-10-CM

## 2017-03-02 NOTE — Assessment & Plan Note (Signed)
Controlled on Linzess Continue high fiber diet

## 2017-03-02 NOTE — Progress Notes (Signed)
Subjective:    Patient ID: Shannon Mccoy, female    DOB: 11-05-1995, 22 y.o.   MRN: [id number]  HPI  Pt presents to the clinic today for her annual exam.   IBS: Mainly constipation. Controlled with Linzess.  Flu: 11/2016 Tetanus: 11/2014 Dentist: biannually  Diet: She does eat meat. She consumes fruits and veggies daily. She occassionally eats fried foods. She drinks mostly water and sweet tea. Exercise: She walks 3-4 days per week for 2-3 miles.  Review of Systems      Past Medical History:  Diagnosis Date  . Allergy   . IBS (irritable bowel syndrome)   . Otitis media     Current Outpatient Medications  Medication Sig Dispense Refill  . AMITIZA 24 MCG capsule Take 24 mcg by mouth 2 (two) times daily.  2  . ondansetron (ZOFRAN ODT) 4 MG disintegrating tablet Take 1 tablet (4 mg total) by mouth every 8 (eight) hours as needed for nausea or vomiting. 20 tablet 0   No current facility-administered medications for this visit.     Allergies  Allergen Reactions  . Fish Allergy Nausea And Vomiting  . Food Nausea And Vomiting    Coconut, fish, mushrooms  . Sulfa Antibiotics     Unknown reaction.     Family History  Problem Relation Age of Onset  . Breast cancer Maternal Aunt   . Dementia Maternal Grandmother   . Hyperlipidemia Maternal Grandfather   . Kidney disease Maternal Grandfather     Social History   Socioeconomic History  . Marital status: Single    Spouse name: Not on file  . Number of children: Not on file  . Years of education: Not on file  . Highest education level: Not on file  Social Needs  . Financial resource strain: Not on file  . Food insecurity - worry: Not on file  . Food insecurity - inability: Not on file  . Transportation needs - medical: Not on file  . Transportation needs - non-medical: Not on file  Occupational History  . Not on file  Tobacco Use  . Smoking status: Never Smoker  . Smokeless tobacco: Never Used    Substance and Sexual Activity  . Alcohol use: No  . Drug use: No  . Sexual activity: Not on file  Other Topics Concern  . Not on file  Social History Narrative  . Not on file     Constitutional: Denies fever, malaise, fatigue, headache or abrupt weight changes.  HEENT: Denies eye pain, eye redness, ear pain, ringing in the ears, wax buildup, runny nose, nasal congestion, bloody nose, or sore throat. Respiratory: Denies difficulty breathing, shortness of breath, cough or sputum production.   Cardiovascular: Denies chest pain, chest tightness, palpitations or swelling in the hands or feet.  Gastrointestinal: Pt reports constipation. Denies abdominal pain, bloating, diarrhea or blood in the stool.  GU: Denies urgency, frequency, pain with urination, burning sensation, blood in urine, odor or discharge. Musculoskeletal: Denies decrease in range of motion, difficulty with gait, muscle pain or joint pain and swelling.  Skin: Denies redness, rashes, lesions or ulcercations.  Neurological: Denies dizziness, difficulty with memory, difficulty with speech or problems with balance and coordination.  Psych: Denies anxiety, depression, SI/HI.  No other specific complaints in a complete review of systems (except as listed in HPI above).  Objective:   Physical Exam  BP 110/76   Pulse 78   Temp 98.2 F (36.8 C) (Oral)   Ht 5'  4.75" (1.645 m)   Wt 161 lb (73 kg)   SpO2 98%   BMI 27.00 kg/m   Wt Readings from Last 3 Encounters:  02/10/17 159 lb (72.1 kg)  08/04/16 153 lb (69.4 kg) (82 %, Z= 0.93)*  05/21/16 159 lb 8 oz (72.3 kg) (87 %, Z= 1.13)*   * Growth percentiles are based on CDC (Girls, 2-20 Years) data.    General: Appears her stated age, well developed, well nourished in NAD. Skin: Warm, dry and intact.  HEENT: Head: normal shape and size; Eyes: sclera white, no icterus, conjunctiva pink, PERRLA and EOMs intact; Ears: Tm's gray and intact, normal light reflex; Nose: mucosa pink  and moist, septum midline; Throat/Mouth: Teeth present, mucosa pink and moist, no exudate, lesions or ulcerations noted.  Neck:  Neck supple, trachea midline. No masses, lumps or thyromegaly present.  Cardiovascular: Normal rate and rhythm. S1,S2 noted.  No murmur, rubs or gallops noted. No JVD or BLE edema. Pulmonary/Chest: Normal effort and positive vesicular breath sounds. No respiratory distress. No wheezes, rales or ronchi noted.  Abdomen: Soft and nontender. Normal bowel sounds. No distention or masses noted. Liver, spleen and kidneys non palpable. Musculoskeletal: Strenght 5/5 BUE/BLE. No difficulty with gait.  Neurological: Alert and oriented. Cranial nerves II-XII grossly intact. Coordination normal.  Psychiatric: Mood and affect normal. Behavior is normal. Judgment and thought content normal.     BMET    Component Value Date/Time   NA 139 01/21/2016 1453   K 3.7 01/21/2016 1453   CL 104 01/21/2016 1453   CO2 26 01/21/2016 1453   GLUCOSE 93 01/21/2016 1453   BUN 11 01/21/2016 1453   CREATININE 0.67 01/21/2016 1453   CALCIUM 9.7 01/21/2016 1453   GFRNONAA >60 10/22/2015 1528   GFRAA >60 10/22/2015 1528    Lipid Panel     Component Value Date/Time   CHOL 139 01/21/2016 1453   TRIG 56.0 01/21/2016 1453   HDL 42.20 01/21/2016 1453   CHOLHDL 3 01/21/2016 1453   VLDL 11.2 01/21/2016 1453   LDLCALC 86 01/21/2016 1453    CBC    Component Value Date/Time   WBC 10.5 01/21/2016 1453   RBC 4.80 01/21/2016 1453   HGB 13.9 01/21/2016 1453   HCT 41.1 01/21/2016 1453   PLT 390.0 01/21/2016 1453   MCV 85.6 01/21/2016 1453   MCH 28.8 10/22/2015 1528   MCHC 33.8 01/21/2016 1453   RDW 12.6 01/21/2016 1453   LYMPHSABS 1.7 05/20/2010 1353   MONOABS 0.9 05/20/2010 1353   EOSABS 0.1 05/20/2010 1353   BASOSABS 0.0 05/20/2010 1353    Hgb A1C No results found for: HGBA1C         Assessment & Plan:   Preventative Health Maintenance:  Flu and tetanus UTD Encouraged her  to consume a balanced diet and exercise regimen Advised her to see a dentist annually She declines labs today, including STD screeing  RTC in 1 year, sooner if needed Nicki Reaper, NP

## 2017-03-02 NOTE — Patient Instructions (Signed)

## 2017-03-18 DIAGNOSIS — K5902 Outlet dysfunction constipation: Secondary | ICD-10-CM | POA: Diagnosis not present

## 2017-04-15 ENCOUNTER — Encounter: Payer: Self-pay | Admitting: Internal Medicine

## 2017-04-15 ENCOUNTER — Ambulatory Visit: Payer: BLUE CROSS/BLUE SHIELD | Admitting: Internal Medicine

## 2017-04-15 VITALS — BP 108/70 | HR 109 | Temp 99.6°F | Wt 162.0 lb

## 2017-04-15 DIAGNOSIS — R509 Fever, unspecified: Secondary | ICD-10-CM

## 2017-04-15 DIAGNOSIS — J101 Influenza due to other identified influenza virus with other respiratory manifestations: Secondary | ICD-10-CM

## 2017-04-15 LAB — POC INFLUENZA A&B (BINAX/QUICKVUE)
Influenza A, POC: POSITIVE — AB
Influenza B, POC: NEGATIVE

## 2017-04-15 MED ORDER — OSELTAMIVIR PHOSPHATE 75 MG PO CAPS: 75.0000 mg | ORAL_CAPSULE | Freq: Two times a day (BID) | ORAL | 0 refills | Status: DC

## 2017-04-15 NOTE — Progress Notes (Signed)
HPI  Pt presents to the clinic today with c/o headache, nasal congestion, sore throat and cough. This started 3 days ago. She is blowing clear mucous out of her nose. She denies difficulty swallowing. The cough is non productive. She denies fever, but has had some chills. She has tried Tylenol Cold and Flu with minimal relief. She has a history of allergies. She has had sick contacts.  Review of Systems      Past Medical History:  Diagnosis Date  . Allergy   . IBS (irritable bowel syndrome)   . Otitis media     Family History  Problem Relation Age of Onset  . Breast cancer Maternal Aunt   . Dementia Maternal Grandmother   . Hyperlipidemia Maternal Grandfather   . Kidney disease Maternal Grandfather     Social History   Socioeconomic History  . Marital status: Single    Spouse name: Not on file  . Number of children: Not on file  . Years of education: Not on file  . Highest education level: Not on file  Social Needs  . Financial resource strain: Not on file  . Food insecurity - worry: Not on file  . Food insecurity - inability: Not on file  . Transportation needs - medical: Not on file  . Transportation needs - non-medical: Not on file  Occupational History  . Not on file  Tobacco Use  . Smoking status: Never Smoker  . Smokeless tobacco: Never Used  Substance and Sexual Activity  . Alcohol use: No  . Drug use: No  . Sexual activity: Not on file  Other Topics Concern  . Not on file  Social History Narrative  . Not on file    Allergies  Allergen Reactions  . Fish Allergy Nausea And Vomiting  . Food Nausea And Vomiting    Coconut, fish, mushrooms  . Sulfa Antibiotics     Unknown reaction.      Constitutional: Positive headache. Denies fatigue, fever or abrupt weight changes.  HEENT:  Positive nasal congestion, sore throat. Denies eye redness, eye pain, pressure behind the eyes, facial pain, ear pain, ringing in the ears, wax buildup, runny nose or bloody  nose. Respiratory: Positive cough. Denies difficulty breathing or shortness of breath.  Cardiovascular: Denies chest pain, chest tightness, palpitations or swelling in the hands or feet.   No other specific complaints in a complete review of systems (except as listed in HPI above).  Objective:   BP 108/70   Pulse (!) 109   Temp 99.6 F (37.6 C) (Oral)   Wt 162 lb (73.5 kg)   LMP 04/08/2017   SpO2 99%   BMI 27.17 kg/m  Wt Readings from Last 3 Encounters:  04/15/17 162 lb (73.5 kg)  03/02/17 161 lb (73 kg)  02/10/17 159 lb (72.1 kg)     General: Appears her stated age, in NAD. HEENT: Head: normal shape and size, no sinus tenderness noted; Ears: Tm's gray and intact, normal light reflex; Nose: mucosa pink and moist, septum midline; Throat/Mouth: Teeth present, mucosa pink and moist, no exudate noted, no lesions or ulcerations noted.  Neck: Bilateral anterior cervical lymphadenopathy.  Cardiovascular: Tachycardic with normal rhythm. S1,S2 noted.  No murmur, rubs or gallops noted.  Pulmonary/Chest: Normal effort and positive vesicular breath sounds. No respiratory distress. No wheezes, rales or ronchi noted.       Assessment & Plan:   Influenza A:  Get some rest and drink plenty of water Do salt water gargles  for the sore throat eRx for Tamiflu 75 mg BID x 5 days Tylenol and Ibuprofen for fever Delsym as needed for cough  RTC as needed or if symptoms persist.   Nicki Reaper, NP

## 2017-04-15 NOTE — Patient Instructions (Signed)

## 2017-04-15 NOTE — Addendum Note (Signed)
Addended by: Roena MaladyEVONTENNO, Jannell Franta Y on: 04/15/2017 11:35 AM   Modules accepted: Orders

## 2017-07-20 ENCOUNTER — Encounter: Payer: Self-pay | Admitting: Internal Medicine

## 2017-07-20 ENCOUNTER — Ambulatory Visit: Payer: BLUE CROSS/BLUE SHIELD | Admitting: Internal Medicine

## 2017-07-20 ENCOUNTER — Ambulatory Visit: Payer: Self-pay

## 2017-07-20 VITALS — BP 106/68 | HR 72 | Temp 97.9°F | Wt 169.0 lb

## 2017-07-20 DIAGNOSIS — E010 Iodine-deficiency related diffuse (endemic) goiter: Secondary | ICD-10-CM

## 2017-07-20 NOTE — Patient Instructions (Signed)
Goiter A goiter is an enlarged thyroid gland. The thyroid gland is located in the lower front of the neck. The gland produces hormones that regulate mood, body temperature, pulse rate, and digestion. Most goiters are painless and are not a cause for serious concern. Goiters and conditions that cause goiters can be treated, if necessary. What are the causes? Causes of this condition include:  Diseases that attack healthy cells in your body (autoimmune diseases) and affect your thyroid function, such as: ? Graves disease. This causes too much thyroid hormone to be produced and it makes your thyroid overly active (hyperthyroidism). ? Hashimoto disease. This type of inflammation of the thyroid (thyroiditis) causes too little thyroid hormone to be produced and it makes your thyroid not active enough (hypothyroidism).  Other conditions that cause thyroiditis.  Nodular goiter. This means that there are one or more small growths on your thyroid. These can create too much thyroid hormone.  Pregnancy.  Thyroid cancer. This is rare.  Certain medicines.  Radiation exposure.  Iodine deficiency.  In some cases, the cause may not be known (idiopathic). What increases the risk? This condition is more likely to develop in:  People who have a family history of goiter.  Women.  People who do not get enough iodine in their diet.  People who are older than 40.  People who smoke tobacco.  What are the signs or symptoms? Common symptoms of this condition include:  Swelling in the lower part of the neck. This swelling can range from a very small bump to a large lump.  A tight feeling in the throat.  A hoarse voice.  Other symptoms include:  Coughing.  Wheezing.  Difficulty swallowing.  Difficulty breathing.  Bulging neck veins.  Dizziness.  In some cases, there are no symptoms and thyroid hormone levels may be normal. When a goiter is the result of hyperthyroidism, symptoms may  also include:  Nervousness or restlessness.  Inability to tolerate heat.  Unexplained weight loss.  Diarrhea.  Change in the texture of hair or skin.  Changes in heart beat, such as skipped beats, extra beats, or a rapid heart rate.  Loss of menstruation.  Shaky hands.  Increased appetite.  Sleep problems.  When a goiter is the result of hypothyroidism, symptoms may also include:  Feeling like you have no energy (lethargy).  Inability to tolerate cold.  Weight gain that is not explained by a change in diet or exercise habits.  Dry skin.  Coarse hair.  Menstrual irregularity.  Constipation.  Sadness or depression.  How is this diagnosed? This condition may be diagnosed with a medical history and physical exam. You may also have other tests, including:  Blood tests to check thyroid function.  Imaging tests, such as: ? Ultrasonography. ? CT scan. ? MRI. ? Thyroid scan. You will be given a safe radioactive injection, then images will be taken of your thyroid.  Tissue sample (biopsy) of the goiter or any nodules. This checks to see if the goiter or nodules are cancerous.  How is this treated? Treatment for this condition depends on the cause. Treatment may include:  Medicines to control your thyroid.  Anti-inflammatory or steroid medicines, if inflammation is the cause.  Iodine supplements or changes in diet, if the goiter is caused by iodine deficiency.  Radiation therapy.  Surgery to remove your thyroid.  In some cases, no treatment is necessary, and your health care provider will monitor your condition at regular checkups. Follow these instructions at   home:  Follow recommendations from your health care provider for any changes to your diet.  Take over-the-counter and prescription medicines only as told by your health care provider.  Do not use any tobacco products, including cigarettes, chewing tobacco, or e-cigarettes. If you need help quitting,  ask your health care provider.  Keep all follow-up appointments as told by your health care provider. This is important. Contact a health care provider if:  Your symptoms do not get better with treatment. Get help right away if:  You develop sudden, unexplained confusion or other mental changes.  You have nausea, vomiting, or diarrhea.  You develop a fever.  Your skin or the whites of your eyes appear yellow (jaundice).  You develop chest pain.  You have trouble breathing or swallowing.  You suddenly become very weak.  You experience extreme restlessness. This information is not intended to replace advice given to you by your health care provider. Make sure you discuss any questions you have with your health care provider. Document Released: 07/30/2009 Document Revised: 08/30/2015 Document Reviewed: 02/05/2014 [employer] Interactive Patient Education  2018 [employer]  

## 2017-07-20 NOTE — Progress Notes (Signed)
Subjective:    Patient ID: Shannon Mccoy, female    DOB: 1995/06/06, 22 y.o.   MRN: [id number]  HPI  Pt presents to the clinic today with concern with neck swelling. She reports she noticed this 3-4 days ago. She denies runny nose, nasal congestion, ear pain or sore throat. She is concerned that it may be her thyroid. She has recently been trying to lose weight with diet and exercise which is proving to be very difficulty for her. She denies fatigue, depression, cold intolerance or constipation.  Review of Systems      Past Medical History:  Diagnosis Date  . Allergy   . IBS (irritable bowel syndrome)   . Otitis media     Current Outpatient Medications  Medication Sig Dispense Refill  . LINZESS 145 MCG CAPS capsule Take 145 mcg by mouth daily before breakfast.   3  . Probiotic Product (DIGESTIVE ADVANTAGE GUMMIES PO) Take 2 each by mouth daily.     No current facility-administered medications for this visit.     Allergies  Allergen Reactions  . Fish Allergy Nausea And Vomiting  . Food Nausea And Vomiting    Coconut, fish, mushrooms  . Sulfa Antibiotics     Unknown reaction.     Family History  Problem Relation Age of Onset  . Breast cancer Maternal Aunt   . Dementia Maternal Grandmother   . Hyperlipidemia Maternal Grandfather   . Kidney disease Maternal Grandfather     Social History   Socioeconomic History  . Marital status: Single    Spouse name: Not on file  . Number of children: Not on file  . Years of education: Not on file  . Highest education level: Not on file  Occupational History  . Not on file  Social Needs  . Financial resource strain: Not on file  . Food insecurity:    Worry: Not on file    Inability: Not on file  . Transportation needs:    Medical: Not on file    Non-medical: Not on file  Tobacco Use  . Smoking status: Never Smoker  . Smokeless tobacco: Never Used  Substance and Sexual Activity  . Alcohol use: No  . Drug use:  No  . Sexual activity: Not on file  Lifestyle  . Physical activity:    Days per week: Not on file    Minutes per session: Not on file  . Stress: Not on file  Relationships  . Social connections:    Talks on phone: Not on file    Gets together: Not on file    Attends religious service: Not on file    Active member of club or organization: Not on file    Attends meetings of clubs or organizations: Not on file    Relationship status: Not on file  . Intimate partner violence:    Fear of current or ex partner: Not on file    Emotionally abused: Not on file    Physically abused: Not on file    Forced sexual activity: Not on file  Other Topics Concern  . Not on file  Social History Narrative  . Not on file     Constitutional: Denies fever, malaise, fatigue, headache or abrupt weight changes.  HEENT: Denies eye pain, eye redness, ear pain, ringing in the ears, wax buildup, runny nose, nasal congestion, bloody nose, or sore throat. Respiratory: Denies difficulty breathing, shortness of breath, cough or sputum production.   Cardiovascular: Denies chest  pain, chest tightness, palpitations or swelling in the hands or feet.  Skin: Pt reports neck swelling. Denies redness, rashes, lesions or ulcercations.    No other specific complaints in a complete review of systems (except as listed in HPI above).  Objective:   Physical Exam   BP 106/68   Pulse 72   Temp 97.9 F (36.6 C) (Oral)   Wt 169 lb (76.7 kg)   LMP 07/06/2017   SpO2 97%   BMI 28.34 kg/m  Wt Readings from Last 3 Encounters:  07/20/17 169 lb (76.7 kg)  04/15/17 162 lb (73.5 kg)  03/02/17 161 lb (73 kg)    General: Appears her stated age, well developed, well nourished in NAD. HEENT: Throat/Mouth: Teeth present, mucosa pink and moist, no exudate, lesions or ulcerations noted.  Neck:  Neck supple, trachea midline. No masses, lumps present. Thyromegaly noted. Cardiovascular: Normal rate and rhythm.  Pulmonary/Chest:  Normal effort and positive vesicular breath sounds. No respiratory distress. No wheezes, rales or ronchi noted.   BMET    Component Value Date/Time   NA 139 01/21/2016 1453   K 3.7 01/21/2016 1453   CL 104 01/21/2016 1453   CO2 26 01/21/2016 1453   GLUCOSE 93 01/21/2016 1453   BUN 11 01/21/2016 1453   CREATININE 0.67 01/21/2016 1453   CALCIUM 9.7 01/21/2016 1453   GFRNONAA >60 10/22/2015 1528   GFRAA >60 10/22/2015 1528    Lipid Panel     Component Value Date/Time   CHOL 139 01/21/2016 1453   TRIG 56.0 01/21/2016 1453   HDL 42.20 01/21/2016 1453   CHOLHDL 3 01/21/2016 1453   VLDL 11.2 01/21/2016 1453   LDLCALC 86 01/21/2016 1453    CBC    Component Value Date/Time   WBC 10.5 01/21/2016 1453   RBC 4.80 01/21/2016 1453   HGB 13.9 01/21/2016 1453   HCT 41.1 01/21/2016 1453   PLT 390.0 01/21/2016 1453   MCV 85.6 01/21/2016 1453   MCH 28.8 10/22/2015 1528   MCHC 33.8 01/21/2016 1453   RDW 12.6 01/21/2016 1453   LYMPHSABS 1.7 05/20/2010 1353   MONOABS 0.9 05/20/2010 1353   EOSABS 0.1 05/20/2010 1353   BASOSABS 0.0 05/20/2010 1353    Hgb A1C No results found for: HGBA1C         Assessment & Plan:   Thyromegaly:  TSH and Free T4 ordered today Consider thyroid ultrasound  Will followup after labs, return precautions discussed Nicki Reaper, NP

## 2017-07-20 NOTE — Telephone Encounter (Signed)
Pt. Reports her mother noticed swelling to the front of her throat/neck this past Saturday. No pain or difficulty swallowing. No sore lymph nodes. No facial swelling.Appointment made for today.  Reason for Disposition . Nursing judgment or information in reference  Answer Assessment - Initial Assessment Questions 1. REASON FOR CALL: "What is your main concern right now?"     Swelling in front of neck at my throat 2. ONSET: "When did the ___ start?"     "My Mom noticed it Saturday." 3. SEVERITY: "How bad is the ___?"     Swelling - "I can't see it." 4. FEVER: "Do you have a fever?"     No 5. OTHER SYMPTOMS: "Do you have any other new symptoms?"     No 6. INTERVENTIONS AND RESPONSE: "What have you done so far to try to make this better? What medications have you used?"     Nothing was done 7. PREGNANCY: "Is there any chance you are pregnant?"     No  Protocols used: NO GUIDELINE AVAILABLE-A-AH

## 2017-07-21 ENCOUNTER — Other Ambulatory Visit: Payer: Self-pay | Admitting: Internal Medicine

## 2017-07-21 DIAGNOSIS — E010 Iodine-deficiency related diffuse (endemic) goiter: Secondary | ICD-10-CM

## 2017-07-21 LAB — T4, FREE: Free T4: 0.74 ng/dL (ref 0.60–1.60)

## 2017-07-21 LAB — TSH: TSH: 2.21 u[IU]/mL (ref 0.35–5.50)

## 2017-07-28 ENCOUNTER — Ambulatory Visit
Admission: RE | Admit: 2017-07-28 | Discharge: 2017-07-28 | Disposition: A | Discharge status: Home / Self Care | Payer: BLUE CROSS/BLUE SHIELD | Source: Ambulatory Visit | Attending: Internal Medicine | Admitting: Internal Medicine

## 2017-07-28 DIAGNOSIS — E010 Iodine-deficiency related diffuse (endemic) goiter: Secondary | ICD-10-CM

## 2017-07-29 ENCOUNTER — Telehealth: Payer: Self-pay | Admitting: Internal Medicine

## 2017-07-29 NOTE — Telephone Encounter (Signed)
Copied from CRM [id number]#[id number]. Topic: Quick Communication - See Telephone Encounter >> Jul 29, 2017  8:31 AM Eston Mouldavis, Bing Duffey B wrote: CRM for notification. See Telephone encounter for: 07/29/17.  [facility] her U/S she had yesterday and would like a call back  .

## 2017-11-25 DIAGNOSIS — T1511XA Foreign body in conjunctival sac, right eye, initial encounter: Secondary | ICD-10-CM | POA: Diagnosis not present

## 2017-12-15 DIAGNOSIS — K5902 Outlet dysfunction constipation: Secondary | ICD-10-CM | POA: Diagnosis not present

## 2018-01-25 ENCOUNTER — Ambulatory Visit: Payer: Self-pay | Admitting: Internal Medicine

## 2018-01-25 NOTE — Progress Notes (Deleted)
Subjective:    Patient ID: Shannon Mccoy, female    DOB: [birth year], 22 y.o.   MRN: [id number]  HPI  Pt presents to the clinic today with c/o ear pain.   Review of Systems      Past Medical History:  Diagnosis Date  . Allergy   . IBS (irritable bowel syndrome)   . Otitis media     Current Outpatient Medications  Medication Sig Dispense Refill  . LINZESS 145 MCG CAPS capsule Take 145 mcg by mouth daily before breakfast.   3  . Probiotic Product (DIGESTIVE ADVANTAGE GUMMIES PO) Take 2 each by mouth daily.     No current facility-administered medications for this visit.     Allergies  Allergen Reactions  . Fish Allergy Nausea And Vomiting  . Food Nausea And Vomiting    Coconut, fish, mushrooms  . Sulfa Antibiotics     Unknown reaction.     Family History  Problem Relation Age of Onset  . Breast cancer Maternal Aunt   . Dementia Maternal Grandmother   . Hyperlipidemia Maternal Grandfather   . Kidney disease Maternal Grandfather     Social History   Socioeconomic History  . Marital status: Single    Spouse name: Not on file  . Number of children: Not on file  . Years of education: Not on file  . Highest education level: Not on file  Occupational History  . Not on file  Social Needs  . Financial resource strain: Not on file  . Food insecurity:    Worry: Not on file    Inability: Not on file  . Transportation needs:    Medical: Not on file    Non-medical: Not on file  Tobacco Use  . Smoking status: Never Smoker  . Smokeless tobacco: Never Used  Substance and Sexual Activity  . Alcohol use: No  . Drug use: No  . Sexual activity: Not on file  Lifestyle  . Physical activity:    Days per week: Not on file    Minutes per session: Not on file  . Stress: Not on file  Relationships  . Social connections:    Talks on phone: Not on file    Gets together: Not on file    Attends religious service: Not on file    Active member of club or  organization: Not on file    Attends meetings of clubs or organizations: Not on file    Relationship status: Not on file  . Intimate partner violence:    Fear of current or ex partner: Not on file    Emotionally abused: Not on file    Physically abused: Not on file    Forced sexual activity: Not on file  Other Topics Concern  . Not on file  Social History Narrative  . Not on file     Constitutional: Denies fever, malaise, fatigue, headache or abrupt weight changes.  HEENT: Denies eye pain, eye redness, ear pain, ringing in the ears, wax buildup, runny nose, nasal congestion, bloody nose, or sore throat. Respiratory: Denies difficulty breathing, shortness of breath, cough or sputum production.   Cardiovascular: Denies chest pain, chest tightness, palpitations or swelling in the hands or feet.  Gastrointestinal: Denies abdominal pain, bloating, constipation, diarrhea or blood in the stool.  GU: Denies urgency, frequency, pain with urination, burning sensation, blood in urine, odor or discharge. Musculoskeletal: Denies decrease in range of motion, difficulty with gait, muscle pain or joint pain and swelling.  Skin: Denies redness, rashes, lesions or ulcercations.  Neurological: Denies dizziness, difficulty with memory, difficulty with speech or problems with balance and coordination.  Psych: Denies anxiety, depression, SI/HI.  No other specific complaints in a complete review of systems (except as listed in HPI above).  Objective:   Physical Exam        Assessment & Plan:

## 2018-01-26 ENCOUNTER — Ambulatory Visit: Payer: BLUE CROSS/BLUE SHIELD | Admitting: Internal Medicine

## 2018-01-26 ENCOUNTER — Encounter: Payer: Self-pay | Admitting: Internal Medicine

## 2018-01-26 VITALS — BP 114/68 | HR 77 | Temp 97.9°F | Wt 176.0 lb

## 2018-01-26 DIAGNOSIS — B9789 Other viral agents as the cause of diseases classified elsewhere: Secondary | ICD-10-CM

## 2018-01-26 DIAGNOSIS — J329 Chronic sinusitis, unspecified: Secondary | ICD-10-CM

## 2018-01-26 MED ORDER — PREDNISONE 10 MG PO TABS: ORAL_TABLET | ORAL | 0 refills | Status: DC

## 2018-01-26 NOTE — Patient Instructions (Signed)

## 2018-01-26 NOTE — Progress Notes (Signed)
HPI  Pt presents to the clinic today with c/o nasal congestion, ear pain and cough. She reports this started 1 week ago. She is blowing clear mucous out of her nose. She describes the ear pain as achy and sensitive. She denies loss of hearing. The cough is non productive. She denies fever, chills or body aches. She has not tried any medications OTC. She has a history of allergies. She has not had sick contacts.  Review of Systems     Past Medical History:  Diagnosis Date  . Allergy   . IBS (irritable bowel syndrome)   . Otitis media     Family History  Problem Relation Age of Onset  . Breast cancer Maternal Aunt   . Dementia Maternal Grandmother   . Hyperlipidemia Maternal Grandfather   . Kidney disease Maternal Grandfather     Social History   Socioeconomic History  . Marital status: Single    Spouse name: Not on file  . Number of children: Not on file  . Years of education: Not on file  . Highest education level: Not on file  Occupational History  . Not on file  Social Needs  . Financial resource strain: Not on file  . Food insecurity:    Worry: Not on file    Inability: Not on file  . Transportation needs:    Medical: Not on file    Non-medical: Not on file  Tobacco Use  . Smoking status: Never Smoker  . Smokeless tobacco: Never Used  Substance and Sexual Activity  . Alcohol use: No  . Drug use: No  . Sexual activity: Not on file  Lifestyle  . Physical activity:    Days per week: Not on file    Minutes per session: Not on file  . Stress: Not on file  Relationships  . Social connections:    Talks on phone: Not on file    Gets together: Not on file    Attends religious service: Not on file    Active member of club or organization: Not on file    Attends meetings of clubs or organizations: Not on file    Relationship status: Not on file  . Intimate partner violence:    Fear of current or ex partner: Not on file    Emotionally abused: Not on file   Physically abused: Not on file    Forced sexual activity: Not on file  Other Topics Concern  . Not on file  Social History Narrative  . Not on file    Allergies  Allergen Reactions  . Fish Allergy Nausea And Vomiting  . Food Nausea And Vomiting    Coconut, fish, mushrooms  . Sulfa Antibiotics     Unknown reaction.      Constitutional: Denies headache, fatigue, fever or abrupt weight changes.  HEENT:  Positive ear pain, nasal congestion. Denies eye redness, ear pain, ringing in the ears, wax buildup, runny nose or bloody nose. Respiratory: Positive cough. Denies difficulty breathing or shortness of breath.  Cardiovascular: Denies chest pain, chest tightness, palpitations or swelling in the hands or feet.   No other specific complaints in a complete review of systems (except as listed in HPI above).  Objective:   BP 114/68   Pulse 77   Temp 97.9 F (36.6 C) (Oral)   Wt 176 lb (79.8 kg)   LMP 01/07/2018   SpO2 98%   BMI 29.51 kg/m   General: Appears her stated age, well developed, well  nourished in NAD. HEENT: Head: normal shape and size, no sinus tenderness noted;  Ears: Tm's gray and intact, normal light reflex; Nose: mucosa boggy and moist, septum midline; Throat/Mouth: + PND. Teeth present, mucosa erythematous and moist, no exudate noted, no lesions or ulcerations noted.  Neck:  No adenopathy noted.  Cardiovascular: Normal rate and rhythm. Pulmonary/Chest: Normal effort and positive vesicular breath sounds. No respiratory distress. No wheezes, rales or ronchi noted.       Assessment & Plan:   Viral Sinusitis  Can use a Neti Pot which can be purchased from your local drug store. No indication for abx at this time She does not like to use Flonase eRx for Pred Taper x 6 days  RTC as needed or if symptoms persist. Nicki Reaper, NP

## 2018-03-26 ENCOUNTER — Ambulatory Visit: Payer: BLUE CROSS/BLUE SHIELD | Admitting: Adult Health

## 2018-03-26 ENCOUNTER — Encounter: Payer: Self-pay | Admitting: Adult Health

## 2018-03-26 ENCOUNTER — Telehealth: Payer: Self-pay

## 2018-03-26 VITALS — BP 110/72 | HR 73 | Temp 97.5°F | Resp 16 | Ht 65.0 in | Wt 166.0 lb

## 2018-03-26 DIAGNOSIS — R1030 Lower abdominal pain, unspecified: Secondary | ICD-10-CM

## 2018-03-26 NOTE — Progress Notes (Signed)
Subjective:    Patient ID: Shannon Mccoy, female    DOB: 06/23/1995, 23 y.o.   MRN: [id number]  HPI 23 year old female who  has a past medical history of Allergy, IBS (irritable bowel syndrome), and Otitis media. She presents to Saturday clinic with her mom.   Significant GI issues since birth. [facility] Family tried calling GI this morning but could not get in contact with on call provider.   She presents to the office today for lower abdominal pain that radiates across the entire abdomen. Pain is felt as a spasm. She has had this pain in the past. Spasms have been waxing and waning throughout the week but this morning spasms were worse to the point of crying. She used a heating pad and did deep breathing exercise which helps relieve the spasms.   She has alternating issues with constipation and diarrhea. Has had a few " small" bowel movements this week that were hard.   She denies fevers, chills, vomiting or diarrhea. Had a few episodes of nausea over the week   She denies UTI like symptoms   Review of Systems  Constitutional: Negative.   Respiratory: Negative.   Cardiovascular: Negative.   Gastrointestinal: Positive for abdominal pain, constipation and nausea. Negative for abdominal distention, anal bleeding, blood in stool and vomiting.  Genitourinary: Negative.   Musculoskeletal: Negative.   Skin: Negative.   Neurological: Negative.   All other systems reviewed and are negative.  Past Medical History:  Diagnosis Date  . Allergy   . IBS (irritable bowel syndrome)   . Otitis media     Social History   Socioeconomic History  . Marital status: Single    Spouse name: Not on file  . Number of children: Not on file  . Years of education: Not on file  . Highest education level: Not on file  Occupational History  . Not on file  Social Needs  . Financial resource strain: Not on file  . Food insecurity:    Worry: Not on file    Inability: Not on file    . Transportation needs:    Medical: Not on file    Non-medical: Not on file  Tobacco Use  . Smoking status: Never Smoker  . Smokeless tobacco: Never Used  Substance and Sexual Activity  . Alcohol use: No  . Drug use: No  . Sexual activity: Not on file  Lifestyle  . Physical activity:    Days per week: Not on file    Minutes per session: Not on file  . Stress: Not on file  Relationships  . Social connections:    Talks on phone: Not on file    Gets together: Not on file    Attends religious service: Not on file    Active member of club or organization: Not on file    Attends meetings of clubs or organizations: Not on file    Relationship status: Not on file  . Intimate partner violence:    Fear of current or ex partner: Not on file    Emotionally abused: Not on file    Physically abused: Not on file    Forced sexual activity: Not on file  Other Topics Concern  . Not on file  Social History Narrative  . Not on file    Past Surgical History:  Procedure Laterality Date  . TYMPANOTOMY      Family History  Problem Relation Age of Onset  .  Breast cancer Maternal Aunt   . Dementia Maternal Grandmother   . Hyperlipidemia Maternal Grandfather   . Kidney disease Maternal Grandfather     Allergies  Allergen Reactions  . Fish Allergy Nausea And Vomiting  . Food Nausea And Vomiting    Coconut, fish, mushrooms  . Sulfa Antibiotics     Unknown reaction.     Current Outpatient Medications on File Prior to Visit  Medication Sig Dispense Refill  . Plecanatide (TRULANCE) 3 MG TABS Take by mouth.     No current facility-administered medications on file prior to visit.     BP 110/72 (BP Location: Left Arm, Patient Position: Sitting, Cuff Size: Small)   Pulse 73   Temp (!) 97.5 F (36.4 C) (Oral)   Resp 16   Ht 5\' 5"  (1.651 m)   Wt 166 lb (75.3 kg)   SpO2 98%   BMI 27.62 kg/m       Objective:   Physical Exam Vitals signs and nursing note reviewed.   Constitutional:      Appearance: Normal appearance.  HENT:     Head: Normocephalic and atraumatic.  Cardiovascular:     Rate and Rhythm: Normal rate and regular rhythm.     Pulses: Normal pulses.     Heart sounds: Normal heart sounds.  Pulmonary:     Effort: Pulmonary effort is normal.     Breath sounds: Normal breath sounds.  Abdominal:     General: Bowel sounds are normal. There is no distension.     Palpations: There is no mass.     Tenderness: There is abdominal tenderness in the periumbilical area and suprapubic area. There is no right CVA tenderness, left CVA tenderness, guarding or rebound. Negative signs include Murphy's sign, McBurney's sign and obturator sign.     Hernia: No hernia is present.  Neurological:     General: No focal deficit present.     Mental Status: She is alert. Mental status is at baseline.  Psychiatric:        Mood and Affect: Mood normal.        Behavior: Behavior normal.        Thought Content: Thought content normal.        Judgment: Judgment normal.       Assessment & Plan:  1. Lower abdominal pain - doubt GI bug. Not concerned for obstruction. Likely degree of constipation. She has reported not doing well with OTC and prescription medication for constipation. Will have her try warm prune juice throughout the day. Follow up with GI if not resolved. Patient and daughter ok with this plan    Shirline Frees, NP

## 2018-03-28 NOTE — Telephone Encounter (Signed)
Patient seen at [facility] on 03/26/18.  Refer to OV note.

## 2018-03-28 NOTE — Telephone Encounter (Signed)
Patient Name: Shannon Mccoy Gender: Female DOB: [birth year] Age: 23 Y 6 M 12 D Return Phone Number: [phone] (Primary) Address[place] [address] Client [facility] - Client Client Site [facility] - Night Physician Shannon Mccoy - NP Contact Type Call Who Is Calling Patient / Member / Family / Caregiver Call Type Triage / Clinical Caller Name Shannon Mccoy Relationship To Patient Mother Return Phone Number [phone] (Primary) Chief Complaint ABDOMINAL PAIN - Severe and only in abdomen Reason for Call Symptomatic / Request for Health Information Initial Comment Caller states daughter has a GI condition. She has severe abdominal pain and diarrhea. She is nauseas Translation No Nurse Assessment Nurse: Laural Benes, RN, Dondra Spry Date/Time Shannon Mccoy Time): 03/26/2018 9:54:24 AM Confirm and document reason for call. If symptomatic, describe symptoms. ---[name] has IBS and started with lower abdominal pain on Tuesday nausea severe pain like muscle spasm lasting 3 minutes and comes every 10 minutes. liquid stool takes Trulance 3mg  one po daily Does the patient have any new or worsening symptoms? ---Yes Will a triage be completed? ---Yes Related visit to physician within the last 2 weeks? ---No Does the PT have any chronic conditions? (i.e. diabetes, asthma, this includes High risk factors for pregnancy, etc.) ---Yes List chronic conditions. ---IBS Is the patient pregnant or possibly pregnant? (Ask all females between the ages of 23-55) ---No Is this a behavioral health or substance abuse call? ---No Guidelines Guideline Title Affirmed Question Affirmed Notes Nurse Date/Time (Eastern Time) Abdominal Pain - Female [1] SEVERE pain (e.g., excruciating) AND [2] present > 1 hour Laural Mccoy, Shannon Mccoy December 03/26/2018 10:00:05 AM Disp. Time Shannon Mccoy Time) Disposition Final User 03/26/2018 9:53:16 AM Send to Urgent Queue Shannon Mccoy,  Shannon Mccoy PLEASE NOTE: All timestamps contained within this report are represented as [place] Standard Time. CONFIDENTIALTY NOTICE: This fax transmission is intended only for the addressee. It contains information that is legally privileged, confidential or otherwise protected from use or disclosure. If you are not the intended recipient, you are strictly prohibited from reviewing, disclosing, copying using or disseminating any of this information or taking any action in reliance on or regarding this information. If you have received this fax in error, please notify us immediately by telephone so that we can arrange for its return to [place]. Phone: [phone], Toll-Free: [phone], Fax: [phone] Page: 2 of 2 Call Id: [id number] 03/26/2018 10:03:11 AM Go to ED Now Yes Laural Benes, RN, Shannon Mccoy Disagree/Comply Comply Caller Understands Yes PreDisposition Go to Urgent Care/Walk-In [facility] Advice Given Per Guideline GO TO ED NOW: DRIVING: Another adult should drive. * Please bring a list of your current medicines when you go to the Emergency Department (ER). * It is also a good idea to bring the pill bottles too. This will help the doctor to make certain you are taking the right medicines and the right dose. NOTHING BY MOUTH: Do not eat or drink anything for now. (Reason: condition may need surgery and general anesthesia.) CARE ADVICE given per Abdominal Pain, Female (Adult) guideline. Referrals [facility] Shannon Mccoy

## 2018-04-15 ENCOUNTER — Encounter: Payer: Self-pay | Admitting: Internal Medicine

## 2018-04-15 ENCOUNTER — Ambulatory Visit (INDEPENDENT_AMBULATORY_CARE_PROVIDER_SITE_OTHER): Payer: BLUE CROSS/BLUE SHIELD | Admitting: Internal Medicine

## 2018-04-15 VITALS — BP 114/68 | HR 66 | Temp 98.0°F | Ht 64.75 in | Wt 165.0 lb

## 2018-04-15 DIAGNOSIS — K581 Irritable bowel syndrome with constipation: Secondary | ICD-10-CM | POA: Diagnosis not present

## 2018-04-15 DIAGNOSIS — Z0000 Encounter for general adult medical examination without abnormal findings: Secondary | ICD-10-CM | POA: Diagnosis not present

## 2018-04-15 NOTE — Assessment & Plan Note (Signed)
Requesting second opinion Referral to GI placed Continue Plecanatide for now

## 2018-04-15 NOTE — Progress Notes (Signed)
Subjective:    Patient ID: Shannon Mccoy, female    DOB: 04-17-1995, 23 y.o.   MRN: [id number]  HPI  Pt presents to the clinic today for her annual exam.  IBS: Mainly constipation. She takes Plecanatide as needed with some relief.   Flu: 11/2017 Tetanus: 11/2014 Pap Smear: never, [facility] Dentist: biannually  Diet: She does eat meat. She consumes fruits and veggies daily. She does eat fried foods. She drinks mostly water and soda. Exercise: None  Review of Systems  Past Medical History:  Diagnosis Date  . Allergy   . IBS (irritable bowel syndrome)   . Otitis media     Current Outpatient Medications  Medication Sig Dispense Refill  . Plecanatide (TRULANCE) 3 MG TABS Take by mouth.     No current facility-administered medications for this visit.     Allergies  Allergen Reactions  . Fish Allergy Nausea And Vomiting  . Food Nausea And Vomiting    Coconut, fish, mushrooms  . Sulfa Antibiotics     Unknown reaction.     Family History  Problem Relation Age of Onset  . Breast cancer Maternal Aunt   . Dementia Maternal Grandmother   . Hyperlipidemia Maternal Grandfather   . Kidney disease Maternal Grandfather     Social History   Socioeconomic History  . Marital status: Single    Spouse name: Not on file  . Number of children: Not on file  . Years of education: Not on file  . Highest education level: Not on file  Occupational History  . Not on file  Social Needs  . Financial resource strain: Not on file  . Food insecurity:    Worry: Not on file    Inability: Not on file  . Transportation needs:    Medical: Not on file    Non-medical: Not on file  Tobacco Use  . Smoking status: Never Smoker  . Smokeless tobacco: Never Used  Substance and Sexual Activity  . Alcohol use: No  . Drug use: No  . Sexual activity: Not on file  Lifestyle  . Physical activity:    Days per week: Not on file    Minutes per session: Not on file  . Stress: Not  on file  Relationships  . Social connections:    Talks on phone: Not on file    Gets together: Not on file    Attends religious service: Not on file    Active member of club or organization: Not on file    Attends meetings of clubs or organizations: Not on file    Relationship status: Not on file  . Intimate partner violence:    Fear of current or ex partner: Not on file    Emotionally abused: Not on file    Physically abused: Not on file    Forced sexual activity: Not on file  Other Topics Concern  . Not on file  Social History Narrative  . Not on file     Constitutional: Denies fever, malaise, fatigue, headache or abrupt weight changes.  HEENT: Denies eye pain, eye redness, ear pain, ringing in the ears, wax buildup, runny nose, nasal congestion, bloody nose, or sore throat. Respiratory: Denies difficulty breathing, shortness of breath, cough or sputum production.   Cardiovascular: Denies chest pain, chest tightness, palpitations or swelling in the hands or feet.  Gastrointestinal: Pt reports constipation. Denies abdominal pain, bloating, diarrhea or blood in the stool.  GU: Denies urgency, frequency, pain with urination,  burning sensation, blood in urine, odor or discharge. Musculoskeletal: Denies decrease in range of motion, difficulty with gait, muscle pain or joint pain and swelling.  Skin: Denies redness, rashes, lesions or ulcercations.  Neurological: Denies dizziness, difficulty with memory, difficulty with speech or problems with balance and coordination.  Psych: Denies anxiety, depression, SI/HI.  No other specific complaints in a complete review of systems (except as listed in HPI above).     Objective:   Physical Exam  Pulse 66   Temp 98 F (36.7 C) (Oral)   Ht 5' 4.75" (1.645 m)   Wt 165 lb (74.8 kg)   LMP 04/06/2018   SpO2 98%   BMI 27.67 kg/m  Wt Readings from Last 3 Encounters:  04/15/18 165 lb (74.8 kg)  03/26/18 166 lb (75.3 kg)  01/26/18 176 lb  (79.8 kg)    General: Appears her stated age, well developed, well nourished in NAD. Skin: Warm, dry and intact.  HEENT: Head: normal shape and size; Eyes: sclera white, no icterus, conjunctiva pink, PERRLA and EOMs intact; Ears: Tm's gray and intact, normal light reflex; Throat/Mouth: Teeth present, mucosa pink and moist, no exudate, lesions or ulcerations noted.  Neck:  Neck supple, trachea midline. No masses, lumps present. Thyromegaly noted. Cardiovascular: Normal rate and rhythm. S1,S2 noted.  No murmur, rubs or gallops noted.  Pulmonary/Chest: Normal effort and positive vesicular breath sounds. No respiratory distress. No wheezes, rales or ronchi noted.  Abdomen: Soft and nontender. Normal bowel sounds. No distention or masses noted. Liver, spleen and kidneys non palpable. Musculoskeletal: Strength 5/5 BUE/BLE. No difficulty with gait.  Neurological: Alert and oriented. Cranial nerves II-XII grossly intact. Coordination normal.  Psychiatric: Mood and affect normal. Behavior is normal. Judgment and thought content normal.     BMET    Component Value Date/Time   NA 139 01/21/2016 1453   K 3.7 01/21/2016 1453   CL 104 01/21/2016 1453   CO2 26 01/21/2016 1453   GLUCOSE 93 01/21/2016 1453   BUN 11 01/21/2016 1453   CREATININE 0.67 01/21/2016 1453   CALCIUM 9.7 01/21/2016 1453   GFRNONAA >60 10/22/2015 1528   GFRAA >60 10/22/2015 1528    Lipid Panel     Component Value Date/Time   CHOL 139 01/21/2016 1453   TRIG 56.0 01/21/2016 1453   HDL 42.20 01/21/2016 1453   CHOLHDL 3 01/21/2016 1453   VLDL 11.2 01/21/2016 1453   LDLCALC 86 01/21/2016 1453    CBC    Component Value Date/Time   WBC 10.5 01/21/2016 1453   RBC 4.80 01/21/2016 1453   HGB 13.9 01/21/2016 1453   HCT 41.1 01/21/2016 1453   PLT 390.0 01/21/2016 1453   MCV 85.6 01/21/2016 1453   MCH 28.8 10/22/2015 1528   MCHC 33.8 01/21/2016 1453   RDW 12.6 01/21/2016 1453   LYMPHSABS 1.7 05/20/2010 1353   MONOABS  0.9 05/20/2010 1353   EOSABS 0.1 05/20/2010 1353   BASOSABS 0.0 05/20/2010 1353    Hgb A1C No results found for: HGBA1C          Assessment & Plan:   Preventative Health Maintenance:  Flu shot UTD Tetanus UTD She will call [facility] for pap smear Encouraged her to consume a balanced diet and exercise regimen Advised her to see an eye doctor and dentist annually Will check CBC, CMET, Lipid profile today  RTC in 1 year, sooner if needed Nicki Reaper, NP

## 2018-04-15 NOTE — Patient Instructions (Signed)

## 2018-04-16 LAB — CBC
HEMATOCRIT: 44 % (ref 35.0–45.0)
Hemoglobin: 14.9 g/dL (ref 11.7–15.5)
MCH: 29 pg (ref 27.0–33.0)
MCHC: 33.9 g/dL (ref 32.0–36.0)
MCV: 85.6 fL (ref 80.0–100.0)
MPV: 9.8 fL (ref 7.5–12.5)
Platelets: 498 10*3/uL — ABNORMAL HIGH (ref 140–400)
RBC: 5.14 10*6/uL — ABNORMAL HIGH (ref 3.80–5.10)
RDW: 11.8 % (ref 11.0–15.0)
WBC: 11.7 10*3/uL — ABNORMAL HIGH (ref 3.8–10.8)

## 2018-04-16 LAB — COMPREHENSIVE METABOLIC PANEL
AG Ratio: 1.9 (calc) (ref 1.0–2.5)
ALT: 14 U/L (ref 6–29)
AST: 16 U/L (ref 10–30)
Albumin: 5 g/dL (ref 3.6–5.1)
Alkaline phosphatase (APISO): 72 U/L (ref 31–125)
BUN: 7 mg/dL (ref 7–25)
CO2: 25 mmol/L (ref 20–32)
Calcium: 10.4 mg/dL — ABNORMAL HIGH (ref 8.6–10.2)
Chloride: 104 mmol/L (ref 98–110)
Creat: 0.71 mg/dL (ref 0.50–1.10)
Globulin: 2.6 g/dL (calc) (ref 1.9–3.7)
Glucose, Bld: 86 mg/dL (ref 65–99)
Potassium: 4.5 mmol/L (ref 3.5–5.3)
Sodium: 139 mmol/L (ref 135–146)
Total Bilirubin: 0.5 mg/dL (ref 0.2–1.2)
Total Protein: 7.6 g/dL (ref 6.1–8.1)

## 2018-04-16 LAB — LIPID PANEL
CHOL/HDL RATIO: 3.8 (calc) (ref <5.0)
Cholesterol: 162 mg/dL (ref <200)
HDL: 43 mg/dL — AB (ref >=50)
LDL Cholesterol (Calc): 105 mg/dL (calc) — ABNORMAL HIGH
NON-HDL CHOLESTEROL (CALC): 119 mg/dL (ref <130)
Triglycerides: 54 mg/dL (ref <150)

## 2018-04-21 ENCOUNTER — Encounter: Payer: Self-pay | Admitting: Gastroenterology

## 2018-04-28 DIAGNOSIS — Z113 Encounter for screening for infections with a predominantly sexual mode of transmission: Secondary | ICD-10-CM | POA: Diagnosis not present

## 2018-04-28 DIAGNOSIS — Z118 Encounter for screening for other infectious and parasitic diseases: Secondary | ICD-10-CM | POA: Diagnosis not present

## 2018-04-28 DIAGNOSIS — Z6827 Body mass index (BMI) 27.0-27.9, adult: Secondary | ICD-10-CM | POA: Diagnosis not present

## 2018-04-28 DIAGNOSIS — Z01419 Encounter for gynecological examination (general) (routine) without abnormal findings: Secondary | ICD-10-CM | POA: Diagnosis not present

## 2018-04-28 LAB — HM PAP SMEAR: HM Pap smear: NORMAL

## 2018-04-29 ENCOUNTER — Encounter: Payer: Self-pay | Admitting: Internal Medicine

## 2018-05-13 ENCOUNTER — Other Ambulatory Visit: Payer: Self-pay

## 2018-05-13 ENCOUNTER — Ambulatory Visit (INDEPENDENT_AMBULATORY_CARE_PROVIDER_SITE_OTHER): Payer: BLUE CROSS/BLUE SHIELD | Admitting: Internal Medicine

## 2018-05-13 ENCOUNTER — Encounter: Payer: Self-pay | Admitting: Internal Medicine

## 2018-05-13 DIAGNOSIS — J301 Allergic rhinitis due to pollen: Secondary | ICD-10-CM

## 2018-05-13 NOTE — Progress Notes (Signed)
Virtual Visit via Telephone Note  I connected with Shannon Mccoy on 05/13/18 at 3:33 pm  by telephone and verified that I am speaking with the correct person using two identifiers.   I discussed the limitations, risks, security and privacy concerns of performing an evaluation and management service by telephone and the availability of in person appointments. I also discussed with the patient that there may be a patient responsible charge related to this service. The patient expressed understanding and agreed to proceed.   History of Present Illness: Pt reports itchy eyes, runny nose and cough. This started 1 week ago. She denies watering or purulent drainage from the eyes. She is blowing clear mucous out of her nose. The cough is non productive. She denies nasal congestion, ear pain, sore throat or shortness of breath. She denies fever, chills or body aches. She has not tried anything OTC. She has not sick contacts. She denies recent travel. She has a history of allergies and has been doing a lot of yard work recently.     Observations/Objective: Alert and oriented. Talks in complete sentences. Cough not observed. No obvious SOB   Assessment and Plan: Allergies. Advised her to start Claritin or Allegra OTC daily x 7-14 days. Can also use Flonase as needed OTC. Work note provided.   Follow Up Instructions:    I discussed the assessment and treatment plan with the patient. The patient was provided an opportunity to ask questions and all were answered. The patient agreed with the plan and demonstrated an understanding of the instructions.   The patient was advised to call back or seek an in-person evaluation if the symptoms worsen or if the condition fails to improve as anticipated.  I provided 5 minutes of non-face-to-face time during this encounter.   Nicki Reaper, NP

## 2018-05-13 NOTE — Patient Instructions (Signed)

## 2018-05-16 ENCOUNTER — Encounter: Payer: Self-pay | Admitting: Internal Medicine

## 2018-05-20 ENCOUNTER — Ambulatory Visit: Payer: Self-pay | Admitting: Gastroenterology

## 2018-06-01 ENCOUNTER — Ambulatory Visit (INDEPENDENT_AMBULATORY_CARE_PROVIDER_SITE_OTHER): Payer: BLUE CROSS/BLUE SHIELD | Admitting: Gastroenterology

## 2018-06-01 ENCOUNTER — Encounter: Payer: Self-pay | Admitting: Gastroenterology

## 2018-06-01 VITALS — Ht 65.0 in | Wt 169.3 lb

## 2018-06-01 DIAGNOSIS — R1084 Generalized abdominal pain: Secondary | ICD-10-CM

## 2018-06-01 DIAGNOSIS — K5902 Outlet dysfunction constipation: Secondary | ICD-10-CM

## 2018-06-01 DIAGNOSIS — R140 Abdominal distension (gaseous): Secondary | ICD-10-CM

## 2018-06-01 MED ORDER — DICYCLOMINE HCL 20 MG PO TABS: 20.0000 mg | ORAL_TABLET | Freq: Three times a day (TID) | ORAL | 3 refills | Status: DC

## 2018-06-01 NOTE — Patient Instructions (Addendum)
I recommend the following diet changes:  - No carbonated beverages - No artificial sweeteners - Full lactose free trial: 3 weeks NO, milk, cheese, sour cream,ice cream, yogurt, creamer, baked goods (cookies/cakes/watch breads), chocolate (even dark chocolate), protein bars or shakes, dressing/condiments (check ingredient label for dairy). You won't live like this forever but for the trial need to be strict.  I am recommending some additional testing with labs and stool studies.   I recommend a trial of dicyclomine 20 mg QID as needed for pain.  Referral to PT and biofeedback in [place].   Start to research the low FODMAP diet. http://www.perez.com/ is the best place on the internet for quality, accurate information.   Follow-up in the next 2 months.

## 2018-06-01 NOTE — Progress Notes (Signed)
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 I am going to refer her for PT locally, and hope that it will be financially possible for her.   At this time will also check for IBS masqueraders including IBD, undiagnosed infections such as Giardia, lactose/fructose/gluten intolerance, SIBO, thyroid disorder.     PLAN: ESR, CRP, fecal calprotectin Giardia Trial of dicyclomine 20 mg QID prn pain Remove dairy Low FODMAP diet Referral to PT and biofeedback Obtain anorectal manometry results from [place] Follow-up in 2  months (30 minutes) Breath testing for SIBO (repeating including methane testing)  Low threshold for EGD with duodenal biopsies and colonoscopy  HPI: Shannon Mccoy is a 23 y.o. female referred by NP [facility] for abdominal pain and constipation. The history is obtained through the patient and review of her electronic health record. 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   Problems with her GI tract since she was a baby. Sensitive to protein.   Chronic constipation with associated bloating. Movement every few days.  Stool consistency fluctuates between liquid and solid. There is a significant urgency.  Her weight fluctuates.  She has used OTC laxatives and was previously on a regimen of Amitiza, Miralax, and magnesium citrate without any relief of her symptoms.  She then took Linzess 145 mcg daily for over 1 year. Most recently on Trulance as needed with some relief  Without a laxative she will go 3 to 4 days between bowel movements have lumpy or hard stool.  Has had to go to the hospital several times for manual disimpaction's.  She is a regular sense of incomplete evacuation.  She frequently pushes and strains.  She has less than 3 bowel movements per week.  She uses physical maneuvers to assist with defecation at least 25% of the time.  Anal rectal manometry demonstrated type I dyssynergia and she was sent to physical therapy.  However but after one session she did not find it beneficial.  She also found it cost prohibitive.  PT in [facility] was $250/session and she could not afford to go more than once.  She did not have biofeedback performed.  Breath test was negative for SIBO.   Previously followed at [facility]. Last seen 12/15/17. Unhappy with her prior care as she felt that she was just having medications adjusted and that her evaluation was not completed.  She is had more bloating than she expected and this is more than she has had in  the past. Her stomach feels tight. Will continue to use an enema daily. Worried about possibly IBD or other chronic diseases that have not been diagnosed.   Rarely consumes daily. Glass of milk rarely in the morning. Rare cheese.  Diagnostic Testing available to me:  Labs 12/10/16: TSH 2.254, UgA 237, TTGA <1.2  Ultrasound of the pelvis on October 22, 2015 demonstrate absence of a right ovarian mass. There is a small amount of  fluid seen likely physiologic. The left ovary is not well-visualized and a normal uterus and endometrium.  CT of the abdomen and pelvis with contrast October 22, 2015 demonstrates a fall small amount of free fluid in the lower pelvic cul-de-sac region. Likely normal physiologic fluid. There is no mass or obvious inflammatory change within either adnexal region. Remainder of the abdomen and CT is unremarkable. There is no bowel obstruction or evidence of small bowel inflammation no abscess.  Brother with gluten sensitivity. No change in her symptoms on gluten sensitive diet.   No known family history of colon cancer or polyps. No family history of uterine/endometrial cancer, pancreatic cancer or gastric/stomach cancer.  Past Medical History:  Diagnosis Date  . Allergy   . IBS (irritable bowel syndrome)   . Otitis media     Past Surgical History:  Procedure Laterality Date  . TYMPANOTOMY      Current Outpatient Medications  Medication Sig Dispense Refill  . Plecanatide (TRULANCE) 3 MG TABS Take by mouth.     No current facility-administered medications for this visit.     Allergies as of 06/01/2018 - Review Complete 06/01/2018  Allergen Reaction Noted  . Fish allergy Nausea And Vomiting 04/04/2011  . Food Nausea And Vomiting 04/04/2011  . Sulfa antibiotics  10/22/2015  . Sulfur Rash 12/10/2016    Family History  Problem Relation Age of Onset  . Breast cancer Maternal Aunt   . Dementia Maternal Grandmother   . Hyperlipidemia Maternal Grandfather   . Kidney disease Maternal Grandfather     Social History   Socioeconomic History  . Marital status: Single    Spouse name: Not on file  . Number of children: 0  . Years of education: Not on file  . Highest education level: Not on file  Occupational History  . Occupation: Forensic psychologist: [employer]  Social Needs  . Financial resource strain: Not on file  . Food insecurity:    Worry: Not on file    Inability:  Not on file  . Transportation needs:    Medical: Not on file    Non-medical: Not on file  Tobacco Use  . Smoking status: Never Smoker  . Smokeless tobacco: Never Used  Substance and Sexual Activity  . Alcohol use: No  . Drug use: No  . Sexual activity: Not Currently  Lifestyle  . Physical activity:    Days per week: 3 days    Minutes per session: 60 min  . Stress: Only a little  Relationships  . Social connections:    Talks on phone: Not on file    Gets together: Not on file    Attends religious service: Not on file    Active member of club or organization: Not on file    Attends meetings of clubs or organizations: Not on file    Relationship status: Not on file  . Intimate partner violence:    Fear of current or ex partner: Not on file    Emotionally abused: Not on  file    Physically abused: Not on file    Forced sexual activity: Not on file  Other Topics Concern  . Not on file  Social History Narrative  . Not on file    Review of Systems: ALL ROS discussed and all others negative except listed in HPI.  Physical Exam: General: in no acute distress Neuro: Alert and appropriate Psych: Normal affect and normal insight   Jaiyon Wander L. Tarri Glenn, MD, MPH [facility] 06/01/2018, 1:31 PM

## 2018-06-29 ENCOUNTER — Other Ambulatory Visit: Payer: Self-pay

## 2018-06-29 ENCOUNTER — Ambulatory Visit (INDEPENDENT_AMBULATORY_CARE_PROVIDER_SITE_OTHER): Payer: BLUE CROSS/BLUE SHIELD | Admitting: Gastroenterology

## 2018-06-29 ENCOUNTER — Encounter: Payer: Self-pay | Admitting: Gastroenterology

## 2018-06-29 VITALS — Ht 65.0 in | Wt 169.0 lb

## 2018-06-29 DIAGNOSIS — R140 Abdominal distension (gaseous): Secondary | ICD-10-CM | POA: Diagnosis not present

## 2018-06-29 DIAGNOSIS — K5902 Outlet dysfunction constipation: Secondary | ICD-10-CM | POA: Diagnosis not present

## 2018-06-29 DIAGNOSIS — R1084 Generalized abdominal pain: Secondary | ICD-10-CM | POA: Diagnosis not present

## 2018-06-29 NOTE — Patient Instructions (Addendum)
I recommend a trial of Citrucel daily to see if some additional stool bulk improves the consistency of your stools.   Continue to avoid dairy products.   I have recommended several stool and blood tests. Please stop by the lab at your convenience to have these tests performed. The lab is open currently 8:00AM-4:00PM, no appointment is needed.  Let's plan to check in with another visit after the stool and blood results are available and you've used the Citrucel every day for at least 3-4 weeks. We can consider additional testing if necessary at that time.  Thank you for your patience with me and our technology today!  I continue to look forward to meeting you in person in the future.

## 2018-06-29 NOTE — Progress Notes (Signed)
TELEHEALTH VISIT  Referring Provider: Jearld Fenton, NP Primary Care Physician:  Jearld Fenton, NP   Tele-visit due to COVID-19 pandemic Patient requested visit virtually, consented to the virtual encounter via video enabled telemedicine application (Doximity, converted to telephone due to poor connection) Contact made at: 14:05 06/29/18 Patient verified by name and date of birth Location of patient: Car (stationary car) Location provider: [place] [facility] Names of persons participating: Me, patient, Patti PJ Martinique CMA Time spent on telehealth visit: 24 minutes I discussed the limitations of evaluation and management by telemedicine. The patient expressed understanding and agreed to proceed.  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 Will also check for IBS masqueraders including IBD, undiagnosed infections such as Giardia, lactose/fructose/gluten intolerance, SIBO, thyroid disorder.    PLAN: ESR, CRP, TSH, fecal calprotectin, Giardia Start daily psyllium for stool bulking (Metamucil, Benefiber, or Citrucel) Continue dicyclomine 20 mg QID prn pain Low FODMAP diet to be considered if symptoms persist Referral to PT and biofeedback when Covid19 restrictions are lifted Follow-up in 4-6  months (30 minutes) Consider repeat breath testing for SIBO (repeating including methane testing) if symptoms persist Low threshold for EGD with duodenal biopsies and colonoscopy Low  threshold to consider food allergy testing  HPI: Shannon Mccoy is a 23 y.o. female with a follow-up encounter. The interval history is obtained through the patient and review of her electronic health record. Has not any of the labs or studies as recommended at the time of her last visit.   However, she has followed a strict no dairy diet for 3 weeks. Stools formed up initially but then returned to liquid stools.  She has identified red meat as a possible trigger.  She is now having some liquid stools alternating with formed stools. Continued bloating, but, not as bad when she ate red meat. She feels less bloated and she feels like her abdomen is less tight.  Continues to have a sense of incomplete evacuation. Avoids all straining.   Dicylcomine is helping with the stomach cramps and spasms. Last month she only had them with her menstrual cycle.   She has no new complaints or concerns.   Past Medical History:  Diagnosis Date  . Allergy   . IBS (irritable bowel syndrome)   . Otitis media     Past Surgical History:  Procedure Laterality Date  . TYMPANOTOMY      Current Outpatient Medications  Medication Sig Dispense Refill  . dicyclomine (BENTYL) 20 MG tablet Take 1 tablet (20 mg total) by mouth 4 (four) times daily -  before meals and at bedtime. 120 tablet 3  . Plecanatide (TRULANCE) 3 MG TABS Take 1 tablet by mouth daily.      No current facility-administered medications for this visit.     Allergies as of 06/29/2018 - Review Complete 06/29/2018  Allergen Reaction Noted  . Fish allergy Nausea And Vomiting 04/04/2011  .  Food Nausea And Vomiting 04/04/2011  . Sulfa antibiotics  10/22/2015  . Sulfur Rash 12/10/2016    Family History  Problem Relation Age of Onset  . Breast cancer Maternal Aunt   . Dementia Maternal Grandmother   . Hyperlipidemia Maternal Grandfather   . Kidney disease Maternal Grandfather   . Esophageal cancer Neg Hx   . Rectal cancer Neg Hx      Social History   Socioeconomic History  . Marital status: Single    Spouse name: Not on file  . Number of children: 0  . Years of education: Not on file  . Highest education level: Not on file  Occupational History  . Occupation: Forensic psychologist: [employer]  Social Needs  . Financial resource strain: Not on file  . Food insecurity:    Worry: Not on file    Inability: Not on file  . Transportation needs:    Medical: Not on file    Non-medical: Not on file  Tobacco Use  . Smoking status: Never Smoker  . Smokeless tobacco: Never Used  Substance and Sexual Activity  . Alcohol use: No  . Drug use: No  . Sexual activity: Not Currently  Lifestyle  . Physical activity:    Days per week: 3 days    Minutes per session: 60 min  . Stress: Only a little  Relationships  . Social connections:    Talks on phone: Not on file    Gets together: Not on file    Attends religious service: Not on file    Active member of club or organization: Not on file    Attends meetings of clubs or organizations: Not on file    Relationship status: Not on file  . Intimate partner violence:    Fear of current or ex partner: Not on file    Emotionally abused: Not on file    Physically abused: Not on file    Forced sexual activity: Not on file  Other Topics Concern  . Not on file  Social History Narrative  . Not on file    Physical Exam: General: in no acute distress Neuro: Alert and appropriate Psych: Normal affect and normal insight   Nachman Sundt L. Tarri Glenn, MD, MPH [facility] 06/29/2018, 7:09 PM

## 2018-07-26 ENCOUNTER — Other Ambulatory Visit (INDEPENDENT_AMBULATORY_CARE_PROVIDER_SITE_OTHER): Payer: BC Managed Care – PPO

## 2018-07-26 DIAGNOSIS — K5902 Outlet dysfunction constipation: Secondary | ICD-10-CM

## 2018-07-26 DIAGNOSIS — R140 Abdominal distension (gaseous): Secondary | ICD-10-CM | POA: Diagnosis not present

## 2018-07-26 DIAGNOSIS — R1084 Generalized abdominal pain: Secondary | ICD-10-CM

## 2018-07-26 LAB — TSH: TSH: 1.52 u[IU]/mL (ref 0.35–4.50)

## 2018-07-26 LAB — SEDIMENTATION RATE: Sed Rate: 12 mm/hr (ref 0–20)

## 2018-07-26 LAB — C-REACTIVE PROTEIN: CRP: <1 mg/dL (ref 0.5–20.0)

## 2018-08-02 ENCOUNTER — Other Ambulatory Visit: Payer: BC Managed Care – PPO

## 2018-08-02 DIAGNOSIS — R1084 Generalized abdominal pain: Secondary | ICD-10-CM

## 2018-08-02 DIAGNOSIS — K5902 Outlet dysfunction constipation: Secondary | ICD-10-CM | POA: Diagnosis not present

## 2018-08-02 DIAGNOSIS — R140 Abdominal distension (gaseous): Secondary | ICD-10-CM | POA: Diagnosis not present

## 2018-08-04 LAB — GIARDIA/CRYPTOSPORIDIUM (EIA)
MICRO NUMBER:: 551359
MICRO NUMBER:: 551360
RESULT:: NOT DETECTED
RESULT:: NOT DETECTED
SPECIMEN QUALITY:: ADEQUATE
SPECIMEN QUALITY:: ADEQUATE

## 2018-08-10 LAB — CALPROTECTIN, FECAL: Calprotectin, Fecal: <16 ug/g (ref 0–120)

## 2018-08-25 ENCOUNTER — Telehealth: Payer: Self-pay | Admitting: Internal Medicine

## 2018-08-25 NOTE — Telephone Encounter (Signed)
Patient called and said her work told her she may have been exposed to Covid 19.  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  Patient doesn't have any symptoms.Patient can't come back to work until she's tested.I let patient know [facility] could probably do the tester quicker than [place].  Patient said she'd call [facility] and if she had any problems, she'd call back.

## 2018-09-06 ENCOUNTER — Encounter: Payer: Self-pay | Admitting: Internal Medicine

## 2018-10-11 DIAGNOSIS — B078 Other viral warts: Secondary | ICD-10-CM | POA: Diagnosis not present

## 2018-11-16 DIAGNOSIS — L858 Other specified epidermal thickening: Secondary | ICD-10-CM | POA: Diagnosis not present

## 2018-11-16 DIAGNOSIS — B078 Other viral warts: Secondary | ICD-10-CM | POA: Diagnosis not present

## 2018-12-29 ENCOUNTER — Encounter: Payer: Self-pay | Admitting: Internal Medicine

## 2019-01-03 ENCOUNTER — Ambulatory Visit (INDEPENDENT_AMBULATORY_CARE_PROVIDER_SITE_OTHER): Payer: BC Managed Care – PPO | Admitting: Internal Medicine

## 2019-01-03 ENCOUNTER — Encounter: Payer: Self-pay | Admitting: Internal Medicine

## 2019-01-03 DIAGNOSIS — K580 Irritable bowel syndrome with diarrhea: Secondary | ICD-10-CM

## 2019-01-03 NOTE — Progress Notes (Signed)
Virtual Visit via Video Note  I connected with Shannon Mccoy on 01/03/19 at  4:15 PM EST by a video enabled telemedicine application and verified that I am speaking with the correct person using two identifiers.  Location: Patient: Home Provider: Office   I discussed the limitations of evaluation and management by telemedicine and the availability of in person appointments. The patient expressed understanding and agreed to proceed.  History of Present Illness:  Pt wanting a note for work. She reports she has a history of IBS, which sometimes causes nausea, abdominal pain and diarrhea. She reports if she calls out of work with these symptoms, they want to put her in quarantine for 14 days, because these are so close to her COVID symptoms. She is in the process of requesting FMLA as well. She reports she typically has a 1-2 flares a month that will put her out for 1-2 days per episode.   Past Medical History:  Diagnosis Date  . Allergy   . IBS (irritable bowel syndrome)   . Otitis media     Current Outpatient Medications  Medication Sig Dispense Refill  . dicyclomine (BENTYL) 20 MG tablet Take 1 tablet (20 mg total) by mouth 4 (four) times daily -  before meals and at bedtime. 120 tablet 3  . Plecanatide (TRULANCE) 3 MG TABS Take 1 tablet by mouth daily.      No current facility-administered medications for this visit.     Allergies  Allergen Reactions  . Fish Allergy Nausea And Vomiting  . Food Nausea And Vomiting    Coconut, fish, mushrooms  . Sulfa Antibiotics     Unknown reaction.   . Sulfur Rash    Family History  Problem Relation Age of Onset  . Breast cancer Maternal Aunt   . Dementia Maternal Grandmother   . Hyperlipidemia Maternal Grandfather   . Kidney disease Maternal Grandfather   . Esophageal cancer Neg Hx   . Rectal cancer Neg Hx     Social History   Socioeconomic History  . Marital status: Single    Spouse name: Not on file  . Number of  children: 0  . Years of education: Not on file  . Highest education level: Not on file  Occupational History  . Occupation: Forensic psychologist: [employer]  Social Needs  . Financial resource strain: Not on file  . Food insecurity    Worry: Not on file    Inability: Not on file  . Transportation needs    Medical: Not on file    Non-medical: Not on file  Tobacco Use  . Smoking status: Never Smoker  . Smokeless tobacco: Never Used  Substance and Sexual Activity  . Alcohol use: No  . Drug use: No  . Sexual activity: Not Currently  Lifestyle  . Physical activity    Days per week: 3 days    Minutes per session: 60 min  . Stress: Only a little  Relationships  . Social Herbalist on phone: Not on file    Gets together: Not on file    Attends religious service: Not on file    Active member of club or organization: Not on file    Attends meetings of clubs or organizations: Not on file    Relationship status: Not on file  . Intimate partner violence    Fear of current or ex partner: Not on file    Emotionally abused: Not on file  Physically abused: Not on file    Forced sexual activity: Not on file  Other Topics Concern  . Not on file  Social History Narrative  . Not on file     Constitutional: Denies fever, malaise, fatigue, headache or abrupt weight changes.  Respiratory: Denies difficulty breathing, shortness of breath, cough or sputum production.   Cardiovascular: Denies chest pain, chest tightness, palpitations or swelling in the hands or feet.  Gastrointestinal: Pt reports intermittent nausea, abdominal pain and diarrhea. Denies  bloating, constipation, or blood in the stool.    No other specific complaints in a complete review of systems (except as listed in HPI above).  Observations/Objective:   Wt Readings from Last 3 Encounters:  06/29/18 169 lb (76.7 kg)  06/01/18 169 lb 5 oz (76.8 kg)  04/15/18 165 lb (74.8 kg)    General: Appears  herstated age, well developed, well nourished in NAD. Pulmonary/Chest: Normal effort. No respiratory distress.  Neurological: Alert and oriented.    BMET    Component Value Date/Time   NA 139 04/15/2018 1523   K 4.5 04/15/2018 1523   CL 104 04/15/2018 1523   CO2 25 04/15/2018 1523   GLUCOSE 86 04/15/2018 1523   BUN 7 04/15/2018 1523   CREATININE 0.71 04/15/2018 1523   CALCIUM 10.4 (H) 04/15/2018 1523   GFRNONAA >60 10/22/2015 1528   GFRAA >60 10/22/2015 1528    Lipid Panel     Component Value Date/Time   CHOL 162 04/15/2018 1523   TRIG 54 04/15/2018 1523   HDL 43 (L) 04/15/2018 1523   CHOLHDL 3.8 04/15/2018 1523   VLDL 11.2 01/21/2016 1453   LDLCALC 105 (H) 04/15/2018 1523    CBC    Component Value Date/Time   WBC 11.7 (H) 04/15/2018 1523   RBC 5.14 (H) 04/15/2018 1523   HGB 14.9 04/15/2018 1523   HCT 44.0 04/15/2018 1523   PLT 498 (H) 04/15/2018 1523   MCV 85.6 04/15/2018 1523   MCH 29.0 04/15/2018 1523   MCHC 33.9 04/15/2018 1523   RDW 11.8 04/15/2018 1523   LYMPHSABS 1.7 05/20/2010 1353   MONOABS 0.9 05/20/2010 1353   EOSABS 0.1 05/20/2010 1353   BASOSABS 0.0 05/20/2010 1353    Hgb A1C No results found for: HGBA1C     Assessment and Plan:  IBS, with Diarrhea:  Will give work note stating that she has a chronic condition that includes intermittent nausea, abdominal pain and diarrhea and that she should not have to quarantine for 14 days Will fill out FMLA once she drops off the forms  Return precautions discussed   Follow Up Instructions:    I discussed the assessment and treatment plan with the patient. The patient was provided an opportunity to ask questions and all were answered. The patient agreed with the plan and demonstrated an understanding of the instructions.   The patient was advised to call back or seek an in-person evaluation if the symptoms worsen or if the condition fails to improve as anticipated.   Nicki Reaper, NP

## 2019-01-04 ENCOUNTER — Encounter: Payer: Self-pay | Admitting: Internal Medicine

## 2019-01-04 NOTE — Patient Instructions (Signed)
Diet for Irritable Bowel Syndrome When you have irritable bowel syndrome (IBS), it is very important to eat the foods and follow the eating habits that are best for your condition. IBS may cause various symptoms such as pain in the abdomen, constipation, or diarrhea. Choosing the right foods can help to ease the discomfort from these symptoms. Work with your health care provider and diet and nutrition specialist (dietitian) to find the eating plan that will help to control your symptoms. What are tips for following this plan?      Keep a food diary. This will help you identify foods that cause symptoms. Write down: ? What you eat and when you eat it. ? What symptoms you have. ? When symptoms occur in relation to your meals, such as "pain in abdomen 2 hours after dinner."  Eat your meals slowly and in a relaxed setting.  Aim to eat 5-6 small meals per day. Do not skip meals.  Drink enough fluid to keep your urine pale yellow.  Ask your health care provider if you should take an over-the-counter probiotic to help restore healthy bacteria in your gut (digestive tract). ? Probiotics are foods that contain good bacteria and yeasts.  Your dietitian may have specific dietary recommendations for you based on your symptoms. He or she may recommend that you: ? Avoid foods that cause symptoms. Talk with your dietitian about other ways to get the same nutrients that are in those problem foods. ? Avoid foods with gluten. Gluten is a protein that is found in rye, wheat, and barley. ? Eat more foods that contain soluble fiber. Examples of foods with high soluble fiber include oats, seeds, and certain fruits and vegetables. Take a fiber supplement if directed by your dietitian. ? Reduce or avoid certain foods called FODMAPs. These are foods that contain carbohydrates that are hard to digest. Ask your doctor which foods contain these carbohydrates. What foods are not recommended? The following are some  foods and drinks that may make your symptoms worse:  Fatty foods, such as french fries.  Foods that contain gluten, such as pasta and cereal.  Dairy products, such as milk, cheese, and ice cream.  Chocolate.  Alcohol.  Products with caffeine, such as coffee.  Carbonated drinks, such as soda.  Foods that are high in FODMAPs. These include certain fruits and vegetables.  Products with sweeteners such as honey, high fructose corn syrup, sorbitol, and mannitol. The items listed above may not be a complete list of foods and beverages you should avoid. Contact a dietitian for more information. What foods are good sources of fiber? Your health care provider or dietitian may recommend that you eat more foods that contain fiber. Fiber can help to reduce constipation and other IBS symptoms. Add foods with fiber to your diet a little at a time so your body can get used to them. Too much fiber at one time might cause gas and swelling of your abdomen. The following are some foods that are good sources of fiber:  Berries, such as raspberries, strawberries, and blueberries.  Tomatoes.  Carrots.  Brown rice.  Oats.  Seeds, such as chia and pumpkin seeds. The items listed above may not be a complete list of recommended sources of fiber. Contact your dietitian for more options. Where to find more information  International Foundation for Functional Gastrointestinal Disorders: www.iffgd.org  National Institute of Diabetes and Digestive and Kidney Diseases: www.niddk.nih.gov Summary  When you have irritable bowel syndrome (IBS), it is   very important to eat the foods and follow the eating habits that are best for your condition.  IBS may cause various symptoms such as pain in the abdomen, constipation, or diarrhea.  Choosing the right foods can help to ease the discomfort that comes from symptoms.  Keep a food diary. This will help you identify foods that cause symptoms.  Your health  care provider or diet and nutrition specialist (dietitian) may recommend that you eat more foods that contain fiber. This information is not intended to replace advice given to you by your health care provider. Make sure you discuss any questions you have with your health care provider. Document Released: 05/02/2003 Document Revised: 06/01/2018 Document Reviewed: 10/13/2016 Elsevier Patient Education  2020 Elsevier Inc.  

## 2019-01-18 ENCOUNTER — Encounter: Payer: Self-pay | Admitting: Internal Medicine

## 2019-01-23 DIAGNOSIS — Z0279 Encounter for issue of other medical certificate: Secondary | ICD-10-CM

## 2019-01-23 NOTE — Telephone Encounter (Signed)
FMLA paperwork in [employer] in box for review and signature 

## 2019-01-23 NOTE — Telephone Encounter (Signed)
Signed, given back to Robin 

## 2019-02-02 NOTE — Telephone Encounter (Signed)
Paperwork faxed 11/30 Copy for billing Copy for scan

## 2019-02-06 ENCOUNTER — Encounter: Payer: Self-pay | Admitting: Internal Medicine

## 2019-02-20 ENCOUNTER — Other Ambulatory Visit: Payer: Self-pay | Admitting: Gastroenterology

## 2019-02-20 ENCOUNTER — Encounter: Payer: Self-pay | Admitting: Internal Medicine

## 2019-03-06 ENCOUNTER — Encounter: Payer: Self-pay | Admitting: Internal Medicine

## 2019-03-30 ENCOUNTER — Encounter: Payer: Self-pay | Admitting: Internal Medicine

## 2019-04-29 DIAGNOSIS — R300 Dysuria: Secondary | ICD-10-CM | POA: Diagnosis not present

## 2019-04-29 DIAGNOSIS — Z3202 Encounter for pregnancy test, result negative: Secondary | ICD-10-CM | POA: Diagnosis not present

## 2019-05-01 ENCOUNTER — Telehealth: Payer: Self-pay

## 2019-05-01 ENCOUNTER — Encounter: Payer: Self-pay | Admitting: Internal Medicine

## 2019-05-01 NOTE — Telephone Encounter (Signed)
Pt reports going to the walk in clinic this weekend and was treated for UTI. Advised if still having symptoms when finished with abx to contact this office. Reminded pt of apt on 3/17. Pt verbalized understanding.

## 2019-05-01 NOTE — Telephone Encounter (Signed)
[place] Night - Client TELEPHONE ADVICE RECORD [facility] Patient Name: Shannon Mccoy Gender: Female DOB: [birth year] Age: 24 Y 58 M 14 D Return Phone Number: [phone] (Primary) Address[place] [address] Client [facility] Night - Client Client Site [place] Physician Webb Silversmith - NP Contact Type Call Who Is Calling Patient / Member / Family / Caregiver Call Type Triage / Clinical Relationship To Patient Self Return Phone Number [phone] (Primary) Chief Complaint Abdominal Pain Reason for Call Symptomatic / Request for [place] thinks she has a UTI or kidney stones. Her bladder hurts really bad in the area of her low ABD; in the center. Translation No Nurse Assessment Nurse: Sharlett Iles, RN, Nehemiah Settle Date/Time (Eastern Time): 04/29/2019 4:41:00 PM Confirm and document reason for call. If symptomatic, describe symptoms. ---Caller states she may have a UTI or a kidney stone. She is having bladder pain. She has mild burning while urinating. No frequency. No back pain. No fever. Has the patient had close contact with a person known or suspected to have the novel coronavirus illness OR traveled / lives in area with major community spread (including international travel) in the last 14 days from the onset of symptoms? * If Asymptomatic, screen for exposure and travel within the last 14 days. ---No Does the patient have any new or worsening symptoms? ---Yes Will a triage be completed? ---Yes Related visit to physician within the last 2 weeks? ---No Does the PT have any chronic conditions? (i.e. diabetes, asthma, this includes High risk factors for pregnancy, etc.) ---Yes List chronic conditions. ---IBS Is the patient pregnant or possibly pregnant? (Ask all females between the ages of 1-55) ---No Is this a behavioral health or substance abuse  call? ---No Guidelines Guideline Title Affirmed Question Affirmed Notes Nurse Date/Time (Eastern Time) Urination Pain - Female [1] Painful urination AND [2] EITHER frequency or urgency AND [3] has oncall doctor Sharlett Iles, RN, Nehemiah Settle 04/29/2019 4:42:54 PM PLEASE NOTE: All timestamps contained within this report are represented as [place] Standard Time. CONFIDENTIALTY NOTICE: This fax transmission is intended only for the addressee. It contains information that is legally privileged, confidential or otherwise protected from use or disclosure. If you are not the intended recipient, you are strictly prohibited from reviewing, disclosing, copying using or disseminating any of this information or taking any action in reliance on or regarding this information. If you have received this fax in error, please notify us immediately by telephone so that we can arrange for its return to [place]. Phone: [phone], Toll-Free: [phone], Fax: [phone] Page: 2 of 2 Call Id: [id number] [place]. Time Eilene Ghazi Time) Disposition Final User 04/29/2019 4:48:19 PM Call PCP within 24 Hours Yes Sharlett Iles, RN, Lewayne Bunting Disagree/Comply Comply Caller Understands Yes PreDisposition Did not know what to do Care Advice Given Per Guideline CALL PCP WITHIN 24 HOURS: FLUIDS: Drink extra fluids. Drink 8-10 glasses of liquids a day (Reason: to produce a dilute, non-irritating urine). CRANBERRY JUICE: * Dosage Cranberry Juice Cocktail: 8 oz (240 ml) twice a day. WARM SALINE SITZ BATHS TO REDUCE PAIN: Sit in a warm saline bath for 20 minutes to cleanse the area and to reduce pain. Add 2 oz. of table salt or baking soda to a tub of water. CALL BACK IF: * Fever occurs * Flank pain occurs Comments User: Delfino Lovett, RN Date/Time (Eastern Time): 04/29/2019 4:48:57 PM Encouraged caller to go to a walk in/urgent care and/or try  OTC pyridium for her symptoms. Referrals REFERRED TO PCP OFFICE

## 2019-05-01 NOTE — Telephone Encounter (Signed)
LVM

## 2019-05-02 ENCOUNTER — Encounter: Payer: Self-pay | Admitting: Internal Medicine

## 2019-05-05 ENCOUNTER — Encounter: Payer: Self-pay | Admitting: Internal Medicine

## 2019-05-10 ENCOUNTER — Encounter: Payer: BC Managed Care – PPO | Admitting: Internal Medicine

## 2019-06-21 ENCOUNTER — Other Ambulatory Visit: Payer: Self-pay

## 2019-06-21 ENCOUNTER — Encounter: Payer: Self-pay | Admitting: Internal Medicine

## 2019-06-21 ENCOUNTER — Ambulatory Visit (INDEPENDENT_AMBULATORY_CARE_PROVIDER_SITE_OTHER): Payer: 59 | Admitting: Internal Medicine

## 2019-06-21 VITALS — BP 112/70 | HR 67 | Temp 97.2°F | Ht 65.0 in | Wt 164.0 lb

## 2019-06-21 DIAGNOSIS — Z0000 Encounter for general adult medical examination without abnormal findings: Secondary | ICD-10-CM | POA: Diagnosis not present

## 2019-06-21 DIAGNOSIS — K582 Mixed irritable bowel syndrome: Secondary | ICD-10-CM

## 2019-06-21 NOTE — Assessment & Plan Note (Signed)
Improved, now off meds Will monitor

## 2019-06-21 NOTE — Progress Notes (Signed)
Subjective:    Patient ID: Shannon Mccoy, female    DOB: 11-12-1995, 24 y.o.   MRN: [id number]  HPI  Patient presents to the clinic today for her annual exam.  IBS: Alternating constipation and diarrhea but has improved lately.  She no longer takes Bentyl as needed..  Flu: 09/2018 Tetanus: 11/2014  Covid: none Pap smear: 04/2018 Dentist: Eugenie Birks  Diet: She does eat meat. She consumes fruits and veggies daily. She tries to avoid fried foods. She drinks mostly water. Exercise: Gym, walking 30 minutes at least 7 days per week  Review of Systems  Past Medical History:  Diagnosis Date  . Allergy   . IBS (irritable bowel syndrome)   . Otitis media     Current Outpatient Medications  Medication Sig Dispense Refill  . dicyclomine (BENTYL) 20 MG tablet Take 1 tablet (20 mg total) by mouth 4 (four) times daily -  before meals and at bedtime. 120 tablet 3  . TRULANCE 3 MG TABS TAKE 1 TABLET BY MOUTH EVERY DAY 90 tablet 3   No current facility-administered medications for this visit.    Allergies  Allergen Reactions  . Fish Allergy Nausea And Vomiting  . Food Nausea And Vomiting    Coconut, fish, mushrooms  . Sulfa Antibiotics     Unknown reaction.   . Sulfur Rash    Family History  Problem Relation Age of Onset  . Breast cancer Maternal Aunt   . Dementia Maternal Grandmother   . Hyperlipidemia Maternal Grandfather   . Kidney disease Maternal Grandfather   . Esophageal cancer Neg Hx   . Rectal cancer Neg Hx     Social History   Socioeconomic History  . Marital status: Single    Spouse name: Not on file  . Number of children: 0  . Years of education: Not on file  . Highest education level: Not on file  Occupational History  . Occupation: CNA    Employer: [employer]  Tobacco Use  . Smoking status: Never Smoker  . Smokeless tobacco: Never Used  Substance and Sexual Activity  . Alcohol use: No  . Drug use: No  . Sexual activity: Not  Currently  Other Topics Concern  . Not on file  Social History Narrative  . Not on file   Social Determinants of Health   Financial Resource Strain:   . Difficulty of Paying Living Expenses:   Food Insecurity:   . Worried About Charity fundraiser in the Last Year:   . Arboriculturist in the Last Year:   Transportation Needs:   . Film/video editor (Medical):   Marland Kitchen Lack of Transportation (Non-Medical):   Physical Activity:   . Days of Exercise per Week:   . Minutes of Exercise per Session:   Stress:   . Feeling of Stress :   Social Connections:   . Frequency of Communication with Friends and Family:   . Frequency of Social Gatherings with Friends and Family:   . Attends Religious Services:   . Active Member of Clubs or Organizations:   . Attends Archivist Meetings:   Marland Kitchen Marital Status:   Intimate Partner Violence:   . Fear of Current or Ex-Partner:   . Emotionally Abused:   Marland Kitchen Physically Abused:   . Sexually Abused:      Constitutional: Denies fever, malaise, fatigue, headache or abrupt weight changes.  HEENT: Denies eye pain, eye redness, ear pain, ringing in the ears,  wax buildup, runny nose, nasal congestion, bloody nose, or sore throat. Respiratory: Denies difficulty breathing, shortness of breath, cough or sputum production.   Cardiovascular: Denies chest pain, chest tightness, palpitations or swelling in the hands or feet.  Gastrointestinal: Denies abdominal pain, bloating, constipation, diarrhea or blood in the stool.  GU: Denies urgency, frequency, pain with urination, burning sensation, blood in urine, odor or discharge. Musculoskeletal: Denies decrease in range of motion, difficulty with gait, muscle pain or joint pain and swelling.  Skin: Denies redness, rashes, lesions or ulcercations.  Neurological: Denies dizziness, difficulty with memory, difficulty with speech or problems with balance and coordination.  Psych: Denies anxiety, depression,  SI/HI.  No other specific complaints in a complete review of systems (except as listed in HPI above).     Objective:   Physical Exam BP 112/70   Pulse 67   Temp (!) 97.2 F (36.2 C) (Temporal)   Ht 5\' 5"  (1.651 m)   Wt 164 lb (74.4 kg)   LMP 06/09/2019   SpO2 98%   BMI 27.29 kg/m   Wt Readings from Last 3 Encounters:  06/29/18 169 lb (76.7 kg)  06/01/18 169 lb 5 oz (76.8 kg)  04/15/18 165 lb (74.8 kg)    General: Appears her stated age, well developed, well nourished in NAD. Skin: Warm, dry and intact. No rashes noted. HEENT: Head: normal shape and size; Eyes: sclera white, no icterus, conjunctiva pink, PERRLA and EOMs intact;  Neck:  Neck supple, trachea midline. No masses, lumps or thyromegaly present.  Cardiovascular: Normal rate and rhythm. S1,S2 noted.  No murmur, rubs or gallops noted. No JVD or BLE edema.  Pulmonary/Chest: Normal effort and positive vesicular breath sounds. No respiratory distress. No wheezes, rales or ronchi noted.  Abdomen: Soft and nontender. Normal bowel sounds. No distention or masses noted. Liver, spleen and kidneys non palpable. Musculoskeletal: Strength 5/5 BUE/BLE. No difficulty with gait.  Neurological: Alert and oriented. Cranial nerves II-XII grossly intact. Coordination normal.  Psychiatric: Mood and affect normal. Behavior is normal. Judgment and thought content normal.     BMET    Component Value Date/Time   NA 139 04/15/2018 1523   K 4.5 04/15/2018 1523   CL 104 04/15/2018 1523   CO2 25 04/15/2018 1523   GLUCOSE 86 04/15/2018 1523   BUN 7 04/15/2018 1523   CREATININE 0.71 04/15/2018 1523   CALCIUM 10.4 (H) 04/15/2018 1523   GFRNONAA >60 10/22/2015 1528   GFRAA >60 10/22/2015 1528    Lipid Panel     Component Value Date/Time   CHOL 162 04/15/2018 1523   TRIG 54 04/15/2018 1523   HDL 43 (L) 04/15/2018 1523   CHOLHDL 3.8 04/15/2018 1523   VLDL 11.2 01/21/2016 1453   LDLCALC 105 (H) 04/15/2018 1523    CBC     Component Value Date/Time   WBC 11.7 (H) 04/15/2018 1523   RBC 5.14 (H) 04/15/2018 1523   HGB 14.9 04/15/2018 1523   HCT 44.0 04/15/2018 1523   PLT 498 (H) 04/15/2018 1523   MCV 85.6 04/15/2018 1523   MCH 29.0 04/15/2018 1523   MCHC 33.9 04/15/2018 1523   RDW 11.8 04/15/2018 1523   LYMPHSABS 1.7 05/20/2010 1353   MONOABS 0.9 05/20/2010 1353   EOSABS 0.1 05/20/2010 1353   BASOSABS 0.0 05/20/2010 1353    Hgb A1C No results found for: HGBA1C         Assessment & Plan:  Preventative Health Maintenance:  Encouraged her to get a flu  shot in the fall Tetanus UTD Encouraged her to get a Covid vaccine Pap smear UTD Encouraged her to consume a balanced diet and exercise regimen Advised her to see an eye doctor and dentist annually Labs not indicated, she declines STD screen  RTC in 1 year, sooner as needed Nicki Reaper, NP This visit occurred during the SARS-CoV-2 public health emergency.  Safety protocols were in place, including screening questions prior to the visit, additional usage of staff PPE, and extensive cleaning of exam room while observing appropriate contact time as indicated for disinfecting solutions.

## 2019-06-21 NOTE — Patient Instructions (Signed)
Health Maintenance, Female Adopting a healthy lifestyle and getting preventive care are important in promoting health and wellness. Ask your health care provider about:  The right schedule for you to have regular tests and exams.  Things you can do on your own to prevent diseases and keep yourself healthy. What should I know about diet, weight, and exercise? Eat a healthy diet   Eat a diet that includes plenty of vegetables, fruits, low-fat dairy products, and lean protein.  Do not eat a lot of foods that are high in solid fats, added sugars, or sodium. Maintain a healthy weight Body mass index (BMI) is used to identify weight problems. It estimates body fat based on height and weight. Your health care provider can help determine your BMI and help you achieve or maintain a healthy weight. Get regular exercise Get regular exercise. This is one of the most important things you can do for your health. Most adults should:  Exercise for at least 150 minutes each week. The exercise should increase your heart rate and make you sweat (moderate-intensity exercise).  Do strengthening exercises at least twice a week. This is in addition to the moderate-intensity exercise.  Spend less time sitting. Even light physical activity can be beneficial. Watch cholesterol and blood lipids Have your blood tested for lipids and cholesterol at 23 years of age, then have this test every 5 years. Have your cholesterol levels checked more often if:  Your lipid or cholesterol levels are high.  You are older than 23 years of age.  You are at high risk for heart disease. What should I know about cancer screening? Depending on your health history and family history, you may need to have cancer screening at various ages. This may include screening for:  Breast cancer.  Cervical cancer.  Colorectal cancer.  Skin cancer.  Lung cancer. What should I know about heart disease, diabetes, and high blood  pressure? Blood pressure and heart disease  High blood pressure causes heart disease and increases the risk of stroke. This is more likely to develop in people who have high blood pressure readings, are of African descent, or are overweight.  Have your blood pressure checked: ? Every 3-5 years if you are 18-39 years of age. ? Every year if you are 40 years old or older. Diabetes Have regular diabetes screenings. This checks your fasting blood sugar level. Have the screening done:  Once every three years after age 40 if you are at a normal weight and have a low risk for diabetes.  More often and at a younger age if you are overweight or have a high risk for diabetes. What should I know about preventing infection? Hepatitis B If you have a higher risk for hepatitis B, you should be screened for this virus. Talk with your health care provider to find out if you are at risk for hepatitis B infection. Hepatitis C Testing is recommended for:  Everyone born from 1945 through 1965.  Anyone with known risk factors for hepatitis C. Sexually transmitted infections (STIs)  Get screened for STIs, including gonorrhea and chlamydia, if: ? You are sexually active and are younger than 24 years of age. ? You are older than 24 years of age and your health care provider tells you that you are at risk for this type of infection. ? Your sexual activity has changed since you were last screened, and you are at increased risk for chlamydia or gonorrhea. Ask your health care provider if   you are at risk.  Ask your health care provider about whether you are at high risk for HIV. Your health care provider may recommend a prescription medicine to help prevent HIV infection. If you choose to take medicine to prevent HIV, you should first get tested for HIV. You should then be tested every 3 months for as long as you are taking the medicine. Pregnancy  If you are about to stop having your period (premenopausal) and  you may become pregnant, seek counseling before you get pregnant.  Take 400 to 800 micrograms (mcg) of folic acid every day if you become pregnant.  Ask for birth control (contraception) if you want to prevent pregnancy. Osteoporosis and menopause Osteoporosis is a disease in which the bones lose minerals and strength with aging. This can result in bone fractures. If you are 65 years old or older, or if you are at risk for osteoporosis and fractures, ask your health care provider if you should:  Be screened for bone loss.  Take a calcium or vitamin D supplement to lower your risk of fractures.  Be given hormone replacement therapy (HRT) to treat symptoms of menopause. Follow these instructions at home: Lifestyle  Do not use any products that contain nicotine or tobacco, such as cigarettes, e-cigarettes, and chewing tobacco. If you need help quitting, ask your health care provider.  Do not use street drugs.  Do not share needles.  Ask your health care provider for help if you need support or information about quitting drugs. Alcohol use  Do not drink alcohol if: ? Your health care provider tells you not to drink. ? You are pregnant, may be pregnant, or are planning to become pregnant.  If you drink alcohol: ? Limit how much you use to 0-1 drink a day. ? Limit intake if you are breastfeeding.  Be aware of how much alcohol is in your drink. In the [place] one drink equals one 12 oz bottle of beer (355 mL), one 5 oz glass of wine (148 mL), or one 1 oz glass of hard liquor (44 mL). General instructions  Schedule regular health, dental, and eye exams.  Stay current with your vaccines.  Tell your health care provider if: ? You often feel depressed. ? You have ever been abused or do not feel safe at home. Summary  Adopting a healthy lifestyle and getting preventive care are important in promoting health and wellness.  Follow your health care provider's instructions about healthy  diet, exercising, and getting tested or screened for diseases.  Follow your health care provider's instructions on monitoring your cholesterol and blood pressure. This information is not intended to replace advice given to you by your health care provider. Make sure you discuss any questions you have with your health care provider. Document Revised: 02/02/2018 Document Reviewed: 02/02/2018 Elsevier Patient Education  2020 Elsevier Inc.  

## 2019-06-22 DIAGNOSIS — B078 Other viral warts: Secondary | ICD-10-CM | POA: Diagnosis not present

## 2019-07-19 DIAGNOSIS — B078 Other viral warts: Secondary | ICD-10-CM | POA: Diagnosis not present

## 2019-08-16 DIAGNOSIS — B078 Other viral warts: Secondary | ICD-10-CM | POA: Diagnosis not present

## 2019-09-12 DIAGNOSIS — B078 Other viral warts: Secondary | ICD-10-CM | POA: Diagnosis not present

## 2020-01-30 ENCOUNTER — Encounter: Payer: Self-pay | Admitting: Gastroenterology

## 2020-03-06 ENCOUNTER — Encounter: Payer: Self-pay | Admitting: Gastroenterology

## 2020-03-06 ENCOUNTER — Other Ambulatory Visit (INDEPENDENT_AMBULATORY_CARE_PROVIDER_SITE_OTHER): Payer: 59

## 2020-03-06 ENCOUNTER — Ambulatory Visit (INDEPENDENT_AMBULATORY_CARE_PROVIDER_SITE_OTHER): Payer: 59 | Admitting: Gastroenterology

## 2020-03-06 VITALS — BP 102/64 | HR 88 | Ht 65.0 in | Wt 182.4 lb

## 2020-03-06 DIAGNOSIS — K5902 Outlet dysfunction constipation: Secondary | ICD-10-CM | POA: Diagnosis not present

## 2020-03-06 DIAGNOSIS — R140 Abdominal distension (gaseous): Secondary | ICD-10-CM

## 2020-03-06 DIAGNOSIS — R1084 Generalized abdominal pain: Secondary | ICD-10-CM

## 2020-03-06 LAB — TSH: TSH: 2.57 u[IU]/mL (ref 0.35–4.50)

## 2020-03-06 LAB — COMPREHENSIVE METABOLIC PANEL
ALT: 24 U/L (ref 0–35)
AST: 15 U/L (ref 0–37)
Albumin: 4.6 g/dL (ref 3.5–5.2)
Alkaline Phosphatase: 60 U/L (ref 39–117)
BUN: 11 mg/dL (ref 6–23)
CO2: 28 mEq/L (ref 19–32)
Calcium: 9.4 mg/dL (ref 8.4–10.5)
Chloride: 103 mEq/L (ref 96–112)
Creatinine, Ser: 0.78 mg/dL (ref 0.40–1.20)
GFR: 107.01 mL/min (ref >60.00)
Glucose, Bld: 83 mg/dL (ref 70–99)
Potassium: 3.7 mEq/L (ref 3.5–5.1)
Sodium: 137 mEq/L (ref 135–145)
Total Bilirubin: 0.3 mg/dL (ref 0.2–1.2)
Total Protein: 7.2 g/dL (ref 6.0–8.3)

## 2020-03-06 LAB — CBC
HCT: 40.4 % (ref 36.0–46.0)
Hemoglobin: 13.6 g/dL (ref 12.0–15.0)
MCHC: 33.7 g/dL (ref 30.0–36.0)
MCV: 84.4 fl (ref 78.0–100.0)
Platelets: 400 10*3/uL (ref 150.0–400.0)
RBC: 4.79 Mil/uL (ref 3.87–5.11)
RDW: 12.6 % (ref 11.5–15.5)
WBC: 11.5 10*3/uL — ABNORMAL HIGH (ref 4.0–10.5)

## 2020-03-06 NOTE — Patient Instructions (Addendum)
I recommended labs and endoscopy today to further evaluate your symptoms.  LABS: Your provider has requested that you go to the basement level for lab work before leaving today. Press "B" on the elevator. The lab is located at the first door on the left as you exit the elevator.  HEALTHCARE LAWS AND MY CHART RESULTS: Due to recent changes in healthcare laws, you may see the results of your imaging and laboratory studies on MyChart before your provider has had a chance to review them.  We understand that in some cases there may be results that are confusing or concerning to you. Not all laboratory results come back in the same time frame and the provider may be waiting for multiple results in order to interpret others.  Please give [place] 48 hours in order for your provider to thoroughly review all the results before contacting the office for clarification of your results.   COLONOSCOPY AND ENDOSCOPY: You have been scheduled for an endoscopy and colonoscopy. Please follow the written instructions given to you at your visit today.  PREP: Please pick up your prep supplies at the pharmacy within the next 1-3 days.  INHALERS: If you use inhalers (even only as needed), please bring them with you on the day of your procedure.  Using a squatty potty, or at least squatty positions, may provide some additional relief.   In the meantime,keep track of the foods that seem to trigger symptoms. Some people find that a written record, or food journal, helps them identify problem foods. Record details such as what you eat, what time of day, and how much and how you feel afterward. By avoiding foods that seem to cause discomfort, you may be able to reduce symptoms.   Some dietary tips that may help relief symptoms include: - Drink plenty of water and avoid carbonated beverages, which may cause gas - Don't chew gum or eat too quickly; these behaviors can cause you to swallow air, which can cause gas. - Try eating  smaller meals more frequently and avoid large meals. - Try increasing fiber in your diet gradually. Fiber can help reduce both constipation and diarrhea, but it can also make gas and cramping worse. I recommended slowing increasing the amount of fiber that you eat. - Avoid foods and beverages that make you fee worse. For some people these include alcohol, drinks with caffeine, dairy products, beans, cauliflower, cabbage, and broccoli.   I recommend trying a low FODMAP. This stands for "fermentable oligodi-monosaccharides and polyols," or, more simply, certain types of carbohydrates found in foods that are hard to digest. By following FODMAP, also known as a low-FODMAP diet, you avoid or limit these particular carbohydrates. Some of the foods that contain FODMAPs include: - Fruits such as apples, apricots, blackberries, cherries, mango, nectarines, pears, plums, and watermelon, or juice containing any of these fruits - Canned fruit in natural fruit juice, or large quantities of fruit juice or dried fruit - Vegentables such as artichokes, asparagus, beans, cabbage, cauliflower, garlic and garlic salts, lentils, mushrooms, onions, and sugar snap or snow peas - Dairy products such as milk, milk products, soft cheese, yogurt, custard, and ice cream - Wheat and rye products - Honey and foods with high-fructose corn syrup  - Products, including candy and gum, with sweeteners ending in "-ol" (for example, sorbitol, mannitol, xylitol, and maltitol)  My favorite website for more information include: [email or web address]-ramirez.[email or web address]  REFERRAL TO PHYSICAL THERAPY:  I would also like to have you see the  physical therapy to see if we can retrain your muscles to do a better job evacuating the stools. We have sent the referral to PT. You will receive a call from their office about the date, time and location for your appointment.   If you are age 73 or younger, your body mass index should be between 19-25. Your  Body mass index is 30.35 kg/m. If this is out of the aformentioned range listed, please consider follow up with your Primary Care Provider.   Thank you for trusting me with your gastrointestinal care!    Tressia Danas, MD, MPH

## 2020-03-06 NOTE — Progress Notes (Signed)
Referring Provider: Jearld Fenton, NP Primary Care Physician:  Jearld Fenton, NP  Chief complaint:  Constipation   IMPRESSION:  Constipation due to type I pelvic dyssynergia on anorectal manometry performed at [place]    - 1 session of PT was not beneficial and cost prohibitive    - no biofeedback performed Chronic abdominal pain, bloating, and constipation    - likely IBS overlap    - TSH, IgA, TTGA 12/10/16    - anorectal manometry performed at [facility] 01/13/17    - Test for SIBO negative at [facility]    - failed trials of: laxatives, Amitiza, Miralax, magnesium citrate, Linzess, Trulance Elevated calcium Elevated platelets Multiple family members with liver cancer (maternal grandfather, maternal great grandmother, and maternal aunt)  PLAN: - CMP given documented hyercalcemia 2020, TSH and CBC given history of thrombocytosis - Meat allergy testing - Squatty potty - Trial of low FODMAP diet recommended, reviewed today - Referral to PT to resume biofeedback - Proceed with EGD with duodenal biopsies and colonoscopy given persistent symptoms despite multiple failed therapies to exclude concurrent organic disease - Consider repeat breath testing for SIBO (repeating including methane testing) if symptoms persist and evaluate aboe is negative - High fiber diet not recommended - as it is expected to exacerbate her symptoms  HPI: Virjean Lari Linson is a 25 y.o. female who returns in follow-up. She was last seen 06/29/2018. The interval history is obtained through the patient and review of her electronic health record. Has not any of the labs or studies as recommended at the time of her last visit. She now works as a Quarry manager at [employer].  Ongoing constipation with a sense of incomplete evacuation, abdominal pain, and bloating despite using Miralax in her coffee every morning. Having a bowel movements every 2-4 days. Stool consistency varies. She has reduced red meat consumption without a  change in her symptoms. No change in symptoms with dairy-free diet.   Severe episodes of abdominal pain have improved since she started working at [employer] last year.  Frustrated that she can't lose weight. Her baseline is 150 pounds and she has never weighed this much before. No matter what she eats she feels abdominal bloating, flatus, and early satiety. No nausea.   Some improvement with exercise. Felt like things were more regular with all of her sports teams in high school.   Review of systems is remarkable only for seasonal allergies and fatigue.  CT abd/pelvis with contrast 10/22/15: no GI tract abnormalities except for a fairly large amount of stool within the colon  Labs 04/15/18: calcium elevated at 10.4 (had been repeatedly normal at 9.4-9.7 prior to that time) Labs 04/15/18: normal CBC except for platelets of 498 Labs 08/02/18: normal TSH, ESR, CRP, Giardia, and fecal calprotectin  Past Medical History:  Diagnosis Date  . Allergy   . IBS (irritable bowel syndrome)   . Otitis media     Past Surgical History:  Procedure Laterality Date  . TYMPANOTOMY      No current outpatient medications on file.   No current facility-administered medications for this visit.    Allergies as of 03/06/2020 - Review Complete 03/06/2020  Allergen Reaction Noted  . Fish allergy Nausea And Vomiting 04/04/2011  . Food Nausea And Vomiting 04/04/2011  . Sulfa antibiotics  10/22/2015  . Sulfur Rash 12/10/2016    Family History  Problem Relation Age of Onset  . Breast cancer Maternal Aunt   . Dementia Maternal Grandmother   .  Hyperlipidemia Maternal Grandfather   . Kidney disease Maternal Grandfather   . Celiac disease Brother   . Breast cancer Other        great aunt   . Breast cancer Maternal Great-grandmother   . Diabetes Maternal Great-grandmother   . Liver cancer Maternal Great-grandfather   . Prostate cancer Maternal Great-grandfather   . Esophageal cancer Neg Hx   . Rectal cancer  Neg Hx     Social History   Socioeconomic History  . Marital status: Single    Spouse name: Not on file  . Number of children: 0  . Years of education: Not on file  . Highest education level: Not on file  Occupational History  . Occupation: CNA    Employer: [employer]  Tobacco Use  . Smoking status: Never Smoker  . Smokeless tobacco: Never Used  Vaping Use  . Vaping Use: Never used  Substance and Sexual Activity  . Alcohol use: No  . Drug use: No  . Sexual activity: Not Currently  Other Topics Concern  . Not on file  Social History Narrative  . Not on file   Social Determinants of Health   Financial Resource Strain: Not on file  Food Insecurity: Not on file  Transportation Needs: Not on file  Physical Activity: Not on file  Stress: Not on file  Social Connections: Not on file  Intimate Partner Violence: Not on file    Physical Exam: Vital signs were reviewed. General:   Alert, well-nourished, pleasant and cooperative in NAD.  No pallor. Head:  Normocephalic and atraumatic.  No alopecia. Eyes:  Sclera clear, no icterus.   Conjunctiva pink. Mouth:  No deformity or lesions.  No atrophic glossitis. No chelitis. Neck:  Supple; no thyromegaly. Lungs:  Clear throughout to auscultation.   No wheezes. Heart:  Regular rate and rhythm; no murmurs Abdomen:  Soft, nontender but she localizes pain to the left lower quadrant, normal bowel sounds. No rebound or guarding. No hepatosplenomegaly Rectal:  Deferred  Msk:  Symmetrical without gross deformities. Extremities:  No gross deformities or edema. Neurologic:  Alert and  oriented x4;  grossly nonfocal Skin:  No rash or bruise. Psych:  Alert and cooperative. Normal mood and affect.   Hampton Cost L. Tarri Glenn, MD, MPH [facility] 03/06/2020, 12:38 PM

## 2020-03-10 LAB — ALLERGEN PROFILE, FOOD-MEAT
Beef IgE: <0.1 kU/L
Chicken IgE: <0.1 kU/L
Pork IgE: <0.1 kU/L

## 2020-03-12 ENCOUNTER — Encounter: Payer: Self-pay | Admitting: Internal Medicine

## 2020-03-14 ENCOUNTER — Ambulatory Visit: Payer: 59 | Attending: Orthopaedic Surgery | Admitting: Physical Therapy

## 2020-03-14 ENCOUNTER — Other Ambulatory Visit: Payer: Self-pay

## 2020-03-14 ENCOUNTER — Encounter: Payer: Self-pay | Admitting: Physical Therapy

## 2020-03-14 DIAGNOSIS — R293 Abnormal posture: Secondary | ICD-10-CM | POA: Diagnosis not present

## 2020-03-14 DIAGNOSIS — R252 Cramp and spasm: Secondary | ICD-10-CM | POA: Diagnosis not present

## 2020-03-14 NOTE — Therapy (Addendum)
[facility] [address] [place], [place], [address] Phone: [phone]   Fax:  [phone]  Physical Therapy Evaluation  Patient Details  Name: Shannon Mccoy MRN: [id number] Date of Birth: August 18, 1995 Referring Provider (PT): Tressia Danas, MD   Encounter Date: 03/14/2020   PT End of Session - 03/14/20 1519    Visit Number 1    Date for PT Re-Evaluation 06/06/20    PT Start Time 1146    PT Stop Time 1228    PT Time Calculation (min) 42 min    Activity Tolerance Patient tolerated treatment well    Behavior During Therapy [facility] for tasks assessed/performed           Past Medical History:  Diagnosis Date  . Allergy   . IBS (irritable bowel syndrome)   . Otitis media     Past Surgical History:  Procedure Laterality Date  . TYMPANOTOMY      There were no vitals filed for this visit.    Subjective Assessment - 03/14/20 1153    Subjective Pt states she has had constipation for as long as she can remember.  Pt was very active in school and now goes to the gym.  She is on a low FODMAP diet.  Before she was going 4-5 days but since the new diet going every other day.  States she feels she empties all the way. Abdominal pain is like lower and feels like cramps that would come on and stop.  Last time having pain was December 2.    Patient Stated Goals just know what to do to have regular BM    Currently in Pain? No/denies              [facility] PT Assessment - 03/18/20 0001      Assessment   Medical Diagnosis R10.84 (ICD-10-CM) - Generalized abdominal pain;R14.0 (ICD-10-CM) - Bloating;K59.02 (ICD-10-CM) - Constipation due to outlet dysfunction    Referring Provider (PT) Tressia Danas, MD    Onset Date/Surgical Date --   chronic ongoing issue   Prior Therapy No      Precautions   Precautions None      Restrictions   Weight Bearing Restrictions No      Balance Screen   Has the patient fallen  in the past 6 months No      Home Environment   Living Environment Private residence    Living Arrangements Parent      Prior Function   Level of Independence Independent    Vocation Full time employment;Student    Vocation Requirements CNA; lifting and bathing    Leisure gym      Cognition   Overall Cognitive Status Within Functional Limits for tasks assessed      Posture/Postural Control   Posture/Postural Control Postural limitations    Postural Limitations Increased lumbar lordosis;Anterior pelvic tilt      ROM / Strength   AROM / PROM / Strength AROM;Strength      AROM   Overall AROM Comments lumbar fwd flex 60%      Strength   Overall Strength Comments 5/5 BLE, core has decreased tone palpated      Flexibility   Soft Tissue Assessment /Muscle Length yes    Hamstrings 40% limited      Palpation   Palpation comment tight glutes, lumbar and hamstrings; decreased ribcae excursion with deep inhale and minimal ribcage/diaphragm; movement      Ambulation/Gait   Gait Pattern  Within Functional Limits                      Objective measurements completed on examination: See above findings.     Pelvic Floor Special Questions - 03/18/20 0001    Prior Pelvic/Prostate Exam Yes    Are you Pregnant or attempting pregnancy? No    Urinary Leakage No    Urinary urgency No    Urinary frequency No    Pelvic Floor Internal Exam pt identity confirmed and internal assessed for soft tissue impairment    Exam Type Rectal    Palpation thickened anal sphincters and posterior pelvic floor wall; creates pressure with pushing but sphincters and puborectalis tighten unable to relax and coordinate correctly for fully bulging muscles, able to push finger halfway    Strength weak squeeze, no lift    Strength # of seconds 3    Tone high                    PT Education - 03/14/20 1519    Education Details Access Code: [email or web address]    Person(s) Educated Patient     Methods Explanation;Demonstration;Handout;Verbal cues;Tactile cues    Comprehension Verbalized understanding;Returned demonstration            PT Short Term Goals - 03/18/20 0951      PT SHORT TERM GOAL #1   Title Pt will be ind with intial HEP    Time 4    Period Weeks    Status New    Target Date 04/12/20      PT SHORT TERM GOAL #2   Title Pt will be ind with toileting techniques for greater ease of BM.    Time 4    Period Weeks    Status New    Target Date 04/12/20             PT Long Term Goals - 03/18/20 0947      PT LONG TERM GOAL #1   Title Pt will be able to have BM consistently at least every 2 days without straining due to improved muscle coordination    Time 12    Period Weeks    Status New    Target Date 06/06/20      PT LONG TERM GOAL #2   Title pt will be ind with advanced HEP    Time 12    Period Weeks    Status New    Target Date 06/06/20      PT LONG TERM GOAL #3   Title Pt will demonstrate h/s length of at least 80% bil for improved functional lifting techniques and fwd flexion without straining lumbar region.    Time 12    Period Weeks    Status New    Target Date 06/06/20      PT LONG TERM GOAL #4   Title Pt will demonstrate ability to engage core correctly during functional lifting in order to reduce excessive compensation from other muscle groups including pelvic floor.    Time 12    Period Weeks    Status New    Target Date 06/06/20                  Plan - 03/14/20 1229    Clinical Impression Statement Pt presents to skilled PT due to chronic constipation . Pt has tension throughout hamstrings, lumbar, gluteals.  Pt has hypertrophy of posterior pelvic floor sphincter muscles and difficulty  relaxing.  Pt has limited lumbar flexion and hamstring flexibility.  Pt has limited ribcage mobility and decreased diphragm movement.  Pt has lowered endurance of pelvic floor with only 3 second hold and 2/5 MMT due to already a lot of  tension.  Pt did okay with pushing one finger out and was able to relax halfway and created some intra-abdominal pressure but only enough to get finger halfway down.  Pt will benefit from skilled PT to work on impairments so she can ensure she maintains imporved bowel function.    Examination-Activity Limitations Toileting    Stability/Clinical Decision Making Stable/Uncomplicated    Clinical Decision Making Low    Rehab Potential Excellent    PT Frequency 1x / week    PT Duration 12 weeks    PT Treatment/Interventions ADLs/Self Care Home Management;Biofeedback;Cryotherapy;Electrical Stimulation;Moist Heat;Therapeutic activities;Therapeutic exercise;Neuromuscular re-education;Patient/family education;Manual techniques;Passive range of motion;Dry needling;Taping    PT Next Visit Plan lumbar and hip stretches; TrA activation, ribcage mobility; f/u on questions about toileting techniques    PT Home Exercise Plan toileting and medbridge stretches - h/s, rib breathing    Consulted and Agree with Plan of Care Patient           Patient will benefit from skilled therapeutic intervention in order to improve the following deficits and impairments:  Pain,Postural dysfunction,Impaired flexibility,Increased fascial restricitons,Decreased strength,Decreased coordination  Visit Diagnosis: Cramp and spasm  Abnormal posture     Problem List Patient Active Problem List   Diagnosis Date Noted  . IBS (irritable bowel syndrome) 10/30/2015    Junious Silk, PT 03/18/2020, 10:13 AM  [facility] [address] [place], [place], [address] Phone: [phone]   Fax:  [phone]  Name: Shannon Mccoy MRN: [id number] Date of Birth: October 30, 1995

## 2020-03-14 NOTE — Patient Instructions (Addendum)
Toileting Techniques for Bowel Movements (Defecation) Using your belly (abdomen) and pelvic floor muscles to have a bowel movement is usually instinctive.  Sometimes people can have problems with these muscles and have to relearn proper defecation (emptying) techniques.  If you have weakness in your muscles, organs that are falling out, decreased sensation in your pelvis, or ignore your urge to go, you may find yourself straining to have a bowel movement.  You are straining if you are: . holding your breath or taking in a huge gulp of air and holding it  . keeping your lips and jaw tensed and closed tightly . turning red in the face because of excessive pushing or forcing . developing or worsening your  hemorrhoids . getting faint while pushing . not emptying completely and have to defecate many times a day  If you are straining, you are actually making it harder for yourself to have a bowel movement.  Many people find they are pulling up with the pelvic floor muscles and closing off instead of opening the anus. Due to lack pelvic floor relaxation and coordination the abdominal muscles, one has to work harder to push the feces out.  Many people have never been taught how to defecate efficiently and effectively.  Notice what happens to your body when you are having a bowel movement.  While you are sitting on the toilet pay attention to the following areas: . Jaw and mouth position . Angle of your hips   . Whether your feet touch the ground or not . Arm placement  . Spine position . Waist . Belly tension . Anus (opening of the anal canal)  An Evacuation/Defecation Plan   Here are the 4 basic points:  1. Lean forward enough for your elbows to rest on your knees 2. Support your feet on the floor or use a low stool if your feet don't touch the floor  3. Push out your belly as if you have swallowed a beach ball--you should feel a widening of your waist 4. Open and relax your pelvic floor  muscles, rather than tightening around the anus       The following conditions my require modifications to your toileting posture:  . If you have had surgery in the past that limits your back, hip, pelvic, knee or ankle flexibility . Constipation   Your healthcare practitioner may make the following additional suggestions and adjustments:  1) Sit on the toilet  a) Make sure your feet are supported. b) Notice your hip angle and spine position--most people find it effective to lean forward or raise their knees, which can help the muscles around the anus to relax  c) When you lean forward, place your forearms on your thighs for support  2) Relax suggestions a) Breath deeply in through your nose and out slowly through your mouth as if you are smelling the flowers and blowing out the candles. b) To become aware of how to relax your muscles, contracting and releasing muscles can be helpful.  Pull your pelvic floor muscles in tightly by using the image of holding back gas, or closing around the anus (visualize making a circle smaller) and lifting the anus up and in.  Then release the muscles and your anus should drop down and feel open. Repeat 5 times ending with the feeling of relaxation. c) Keep your pelvic floor muscles relaxed; let your belly bulge out. d) The digestive tract starts at the mouth and ends at the anal opening, so  be sure to relax both ends of the tube.  Place your tongue on the roof of your mouth with your teeth separated.  This helps relax your mouth and will help to relax the anus at the same time.  3) Empty (defecation) a) Keep your pelvic floor and sphincter relaxed, then bulge your anal muscles.  Make the anal opening wide.  b) Stick your belly out as if you have swallowed a beach ball. c) Make your belly wall hard using your belly muscles while continuing to breathe. Doing this makes it easier to open your anus. d) Breath out and give a grunt (or try using other sounds  such as ahhhh, shhhhh, ohhhh or grrrrrrr).  4) Finish a) As you finish your bowel movement, pull the pelvic floor muscles up and in.  This will leave your anus in the proper place rather than remaining pushed out and down. If you leave your anus pushed out and down, it will start to feel as though that is normal and give you incorrect signals about needing to have a bowel movement.  Access Code: [email or web address] URL: [email or web address] Date: 03/14/2020 Prepared by: Dwana Curd  Exercises Modified Supine Hamstring Stretch with Doorway - 1 x daily - 7 x weekly - 3 sets - 1 reps - 30 hold Standing Hamstring Stretch on Chair - 1 x daily - 7 x weekly - 3 sets - 1 reps - 30 hold Supine Breathing with Hands on Ribcage - 2 x daily - 7 x weekly - 1 sets - 10 reps

## 2020-03-18 NOTE — Addendum Note (Signed)
Addended by: Beatris Si on: 03/18/2020 10:16 AM   Modules accepted: Orders

## 2020-04-24 ENCOUNTER — Encounter: Payer: 59 | Admitting: Physical Therapy

## 2020-05-15 ENCOUNTER — Encounter: Payer: 59 | Admitting: Internal Medicine

## 2020-05-17 ENCOUNTER — Encounter: Payer: Self-pay | Admitting: Gastroenterology

## 2020-05-21 ENCOUNTER — Other Ambulatory Visit: Payer: Self-pay

## 2020-05-21 ENCOUNTER — Ambulatory Visit (AMBULATORY_SURGERY_CENTER): Payer: 59 | Admitting: Gastroenterology

## 2020-05-21 ENCOUNTER — Encounter: Payer: Self-pay | Admitting: Gastroenterology

## 2020-05-21 VITALS — BP 118/82 | HR 81 | Temp 97.7°F | Resp 11 | Ht 65.0 in | Wt 182.0 lb

## 2020-05-21 DIAGNOSIS — R140 Abdominal distension (gaseous): Secondary | ICD-10-CM

## 2020-05-21 DIAGNOSIS — K2970 Gastritis, unspecified, without bleeding: Secondary | ICD-10-CM | POA: Diagnosis not present

## 2020-05-21 DIAGNOSIS — K648 Other hemorrhoids: Secondary | ICD-10-CM

## 2020-05-21 DIAGNOSIS — K5902 Outlet dysfunction constipation: Secondary | ICD-10-CM

## 2020-05-21 DIAGNOSIS — R109 Unspecified abdominal pain: Secondary | ICD-10-CM | POA: Diagnosis not present

## 2020-05-21 DIAGNOSIS — R1084 Generalized abdominal pain: Secondary | ICD-10-CM

## 2020-05-21 DIAGNOSIS — K2950 Unspecified chronic gastritis without bleeding: Secondary | ICD-10-CM

## 2020-05-21 DIAGNOSIS — K5900 Constipation, unspecified: Secondary | ICD-10-CM | POA: Diagnosis not present

## 2020-05-21 HISTORY — PX: COLONOSCOPY: SHX174

## 2020-05-21 HISTORY — PX: UPPER GASTROINTESTINAL ENDOSCOPY: SHX188

## 2020-05-21 MED ORDER — SODIUM CHLORIDE 0.9 % IV SOLN: 500.0000 mL | Freq: Once | INTRAVENOUS | Status: DC

## 2020-05-21 NOTE — Patient Instructions (Addendum)
Handout given for hemorrhoids.  Await pathology results.  No NSAIDS (aspirin, ibuprofen, naproxen or aleve)  You may use tylenol if needed.  YOU HAD AN ENDOSCOPIC PROCEDURE TODAY AT THE [facility]:   Refer to the procedure report that was given to you for any specific questions about what was found during the examination.  If the procedure report does not answer your questions, please call your gastroenterologist to clarify.  If you requested that your care partner not be given the details of your procedure findings, then the procedure report has been included in a sealed envelope for you to review at your convenience later.  YOU SHOULD EXPECT: Some feelings of bloating in the abdomen. Passage of more gas than usual.  Walking can help get rid of the air that was put into your GI tract during the procedure and reduce the bloating. If you had a lower endoscopy (such as a colonoscopy or flexible sigmoidoscopy) you may notice spotting of blood in your stool or on the toilet paper. If you underwent a bowel prep for your procedure, you may not have a normal bowel movement for a few days.  Please Note:  You might notice some irritation and congestion in your nose or some drainage.  This is from the oxygen used during your procedure.  There is no need for concern and it should clear up in a day or so.  SYMPTOMS TO REPORT IMMEDIATELY:   Following lower endoscopy (colonoscopy or flexible sigmoidoscopy):  Excessive amounts of blood in the stool  Significant tenderness or worsening of abdominal pains  Swelling of the abdomen that is new, acute  Fever of 100F or higher   Following upper endoscopy (EGD)  Vomiting of blood or coffee ground material  New chest pain or pain under the shoulder blades  Painful or persistently difficult swallowing  New shortness of breath  Fever of 100F or higher  Black, tarry-looking stools  For urgent or emergent issues, a gastroenterologist can be reached  at any hour by calling [phone]. Do not use MyChart messaging for urgent concerns.    DIET:  We do recommend a small meal at first, but then you may proceed to your regular diet.  Drink plenty of fluids but you should avoid alcoholic beverages for 24 hours.  ACTIVITY:  You should plan to take it easy for the rest of today and you should NOT DRIVE or use heavy machinery until tomorrow (because of the sedation medicines used during the test).    FOLLOW UP: Our staff will call the number listed on your records 48-72 hours following your procedure to check on you and address any questions or concerns that you may have regarding the information given to you following your procedure. If we do not reach you, we will leave a message.  We will attempt to reach you two times.  During this call, we will ask if you have developed any symptoms of COVID 19. If you develop any symptoms (ie: fever, flu-like symptoms, shortness of breath, cough etc.) before then, please call [phone].  If you test positive for Covid 19 in the 2 weeks post procedure, please call and report this information to [place].    If any biopsies were taken you will be contacted by phone or by letter within the next 1-3 weeks.  Please call us at [phone] if you have not heard about the biopsies in 3 weeks.    SIGNATURES/CONFIDENTIALITY: You and/or your care partner have  signed paperwork which will be entered into your electronic medical record.  These signatures attest to the fact that that the information above on your After Visit Summary has been reviewed and is understood.  Full responsibility of the confidentiality of this discharge information lies with you and/or your care-partner.

## 2020-05-21 NOTE — Progress Notes (Signed)
pt tolerated well. VSS. awake and to recovery. Report given to RN. Bite block insitu to recovery. 

## 2020-05-21 NOTE — Op Note (Signed)
[facility] Patient Name: Shannon Mccoy Procedure Date: 05/21/2020 11:56 AM MRN: [id number] Endoscopist: Tressia Danas MD, MD Age: 25 Referring MD:  Date of Birth: 20-Jul-1995 Gender: Female Account #: [id number] Procedure:                Upper GI endoscopy Indications:              Abdominal pain, Abdominal bloating Medicines:                Monitored Anesthesia Care Procedure:                Pre-Anesthesia Assessment:                           - Prior to the procedure, a History and Physical                            was performed, and patient medications and                            allergies were reviewed. The patient's tolerance of                            previous anesthesia was also reviewed. The risks                            and benefits of the procedure and the sedation                            options and risks were discussed with the patient.                            All questions were answered, and informed consent                            was obtained. Prior Anticoagulants: The patient has                            taken no previous anticoagulant or antiplatelet                            agents. ASA Grade Assessment: II - A patient with                            mild systemic disease. After reviewing the risks                            and benefits, the patient was deemed in                            satisfactory condition to undergo the procedure.                           After obtaining informed consent, the endoscope was  passed under direct vision. Throughout the                            procedure, the patient's blood pressure, pulse, and                            oxygen saturations were monitored continuously. The                            Endoscope was introduced through the mouth, and                            advanced to the third part of duodenum. The upper                            GI endoscopy was  accomplished without difficulty.                            The patient tolerated the procedure well. Scope In: Scope Out: Findings:                 The esophagus was normal.                           Patchy mildly erythematous mucosa without bleeding                            was found in the gastric antrum. Biopsies were                            taken from the antrum, body, and fundus with a cold                            forceps for histology. Estimated blood loss was                            minimal.                           The examined duodenum was normal. Biopsies were                            taken with a cold forceps for histology. Estimated                            blood loss was minimal.                           The cardia and gastric fundus were normal on                            retroflexion.                           The exam was otherwise without abnormality. Complications:  No immediate complications. Estimated blood loss:                            Minimal. Estimated Blood Loss:     Estimated blood loss was minimal. Impression:               - Normal esophagus.                           - Erythematous mucosa in the antrum. Biopsied.                           - Normal examined duodenum. Biopsied.                           - The examination was otherwise normal. Recommendation:           - Patient has a contact number available for                            emergencies. The signs and symptoms of potential                            delayed complications were discussed with the                            patient. Return to normal activities tomorrow.                            Written discharge instructions were provided to the                            patient.                           - Resume previous diet.                           - Continue present medications.                           - Await pathology results.                            - No aspirin, ibuprofen, naproxen, or other                            non-steroidal anti-inflammatory drugs.                           - Proceed with colonoscopy as previously planned. Tressia Danas MD, MD 05/21/2020 12:39:44 PM This report has been signed electronically.

## 2020-05-21 NOTE — Progress Notes (Signed)
Pt's states no medical or surgical changes since previsit or office visit.  ° °Vitals CW °

## 2020-05-21 NOTE — Op Note (Signed)
[facility] Patient Name: Shannon PrudeFaith Carlo Procedure Date: 05/21/2020 11:51 AM MRN: [id number] Endoscopist: Tressia DanasKimberly Ceylon Arenson MD, MD Age: 25 Referring MD:  Date of Birth: December 31, 1995 Gender: Female Account #: [id number] Procedure:                Colonoscopy Indications:              Abdominal pain Medicines:                Monitored Anesthesia Care Procedure:                Pre-Anesthesia Assessment:                           - Prior to the procedure, a History and Physical                            was performed, and patient medications and                            allergies were reviewed. The patient's tolerance of                            previous anesthesia was also reviewed. The risks                            and benefits of the procedure and the sedation                            options and risks were discussed with the patient.                            All questions were answered, and informed consent                            was obtained. Prior Anticoagulants: The patient has                            taken no previous anticoagulant or antiplatelet                            agents. ASA Grade Assessment: II - A patient with                            mild systemic disease. After reviewing the risks                            and benefits, the patient was deemed in                            satisfactory condition to undergo the procedure.                           After obtaining informed consent, the colonoscope  was passed under direct vision. Throughout the                            procedure, the patient's blood pressure, pulse, and                            oxygen saturations were monitored continuously. The                            Olympus CF-HQ190L (Serial# [id number]) Colonoscope was                            introduced through the anus with the intention of                            advancing to the ascending colon. The  scope was                            advanced to the hepatic flexure before the                            procedure was aborted. Medications were given. The                            colonoscopy was extremely difficult due to a                            redundant colon, significant looping and a tortuous                            colon. Successful completion of the procedure was                            aided by changing the patient's position,                            withdrawing and reinserting the scope,                            straightening and shortening the scope to obtain                            bowel loop reduction and applying abdominal                            pressure. The patient tolerated the procedure well.                            The quality of the bowel preparation was good. The                            rectum was photographed. Scope In: 12:14:48 PM Scope Out: 12:37:32 PM Total Procedure Duration: 0 hours 22 minutes 44 seconds  Findings:  Hemorrhoids were found on perianal exam.                           The entired examined colon was grossly redundant,                            tortuous, and looping. I was unable to advance                            beyond the distal ascending colon due to looping.                           The exam was otherwise without abnormality on                            direct and retroflexion views. Complications:            No immediate complications. Estimated Blood Loss:     Estimated blood loss: none. Impression:               - Hemorrhoids found on perianal exam.                           - Redundant, tortuous and looping colon.                           - The examination was otherwise normal on direct                            and retroflexion views.                           - No specimens collected. Recommendation:           - Patient has a contact number available for                             emergencies. The signs and symptoms of potential                            delayed complications were discussed with the                            patient. Return to normal activities tomorrow.                            Written discharge instructions were provided to the                            patient.                           - Resume previous diet.                           - Continue present medications.                           -  Return to GI office for ongoing care. Tressia Danas MD, MD 05/21/2020 12:47:38 PM This report has been signed electronically.

## 2020-05-23 ENCOUNTER — Telehealth: Payer: Self-pay | Admitting: Gastroenterology

## 2020-05-23 ENCOUNTER — Telehealth: Payer: Self-pay

## 2020-05-23 NOTE — Telephone Encounter (Signed)
Pt is requesting a call back from a nurse to obtain a doctor's note from the procedure she had and is also wanting to know if she could take a certain medication, pt did not disclose any further information.

## 2020-05-23 NOTE — Telephone Encounter (Signed)
  Follow up Call-  Call back number 05/21/2020  Post procedure Call Back phone  # [phone]  Permission to leave phone message Yes  Some recent data might be hidden     Patient questions:  Do you have a fever, pain , or abdominal swelling? No. Pain Score  0 *  Have you tolerated food without any problems? Yes.    Have you been able to return to your normal activities? Yes.    Do you have any questions about your discharge instructions: Diet   No. Medications  No. Follow up visit  No.  Do you have questions or concerns about your Care? No.  Actions: * If pain score is 4 or above: No action needed, pain <4.  1. Have you developed a fever since your procedure? no  2.   Have you had an respiratory symptoms (SOB or cough) since your procedure? no  3.   Have you tested positive for COVID 19 since your procedure no  4.   Have you had any family members/close contacts diagnosed with the COVID 19 since your procedure?  no   If yes to any of these questions please route to Laverna Peace, RN and Karlton Lemon, RN

## 2020-05-24 ENCOUNTER — Encounter: Payer: Self-pay | Admitting: Internal Medicine

## 2020-05-24 NOTE — Telephone Encounter (Signed)
Just called her and she wanted to talk to you and let you know that she is still having abdominal cramping and discomfort. She wanted to know if this was because her "gi tract has loops in it?" States she is also having menstrual cramping and with her stomach hurting she had to call out of work last night.

## 2020-05-24 NOTE — Telephone Encounter (Signed)
Thank you :)

## 2020-05-24 NOTE — Telephone Encounter (Signed)
Please call the patient to see how we can help. Thanks.

## 2020-05-24 NOTE — Telephone Encounter (Signed)
I see that a follow-up appointment was scheduled. Please confirm that she received the letter that she requested. Thank you.

## 2020-05-24 NOTE — Telephone Encounter (Signed)
The letter was sent to pt via mychart and she is aware.

## 2020-06-04 ENCOUNTER — Encounter: Payer: Self-pay | Admitting: Gastroenterology

## 2020-06-11 ENCOUNTER — Other Ambulatory Visit: Payer: Self-pay

## 2020-06-11 ENCOUNTER — Ambulatory Visit (INDEPENDENT_AMBULATORY_CARE_PROVIDER_SITE_OTHER): Payer: 59 | Admitting: Family Medicine

## 2020-06-11 ENCOUNTER — Encounter: Payer: Self-pay | Admitting: Family Medicine

## 2020-06-11 VITALS — BP 100/60 | HR 65 | Temp 97.4°F | Ht 65.0 in | Wt 169.2 lb

## 2020-06-11 DIAGNOSIS — Z0000 Encounter for general adult medical examination without abnormal findings: Secondary | ICD-10-CM

## 2020-06-11 NOTE — Progress Notes (Signed)
Annual Exam   Chief Complaint:  Chief Complaint  Patient presents with  . Annual Exam    Ok'd by insurance.   . Form Completion    Nursing school    History of Present Illness:  Ms. Shannon Mccoy is a 25 y.o. No obstetric history on file. who LMP was No LMP recorded., presents today for her annual examination.      Nutrition/Lifestyle Diet: healthy Exercise: walking on the treadmill, 3 times a week She does get adequate calcium and Vitamin D in her diet.  Social History   Tobacco Use  Smoking Status Never Smoker  Smokeless Tobacco Never Used   Social History   Substance and Sexual Activity  Alcohol Use No   Social History   Substance and Sexual Activity  Drug Use No     Safety The patient wears seatbelts: yes.     The patient feels safe at home and in their relationships: yes.  General Health Dentist in the last year: Yes Eye doctor: yes  Menstrual Her menses are regular every 28-30 days, no concerns  GYN She is not sexually active.     Cervical Cancer Screening (Age 89-65) Last Pap:  March 2020 Results were: no abnormalities with HPV not done  Family History of Breast Cancer: yes, aunt and great-grandmother Family History of Ovarian Cancer: no    Weight Wt Readings from Last 3 Encounters:  06/11/20 169 lb 4 oz (76.8 kg)  05/21/20 182 lb (82.6 kg)  03/06/20 182 lb 6.4 oz (82.7 kg)   Patient has high BMI  BMI Readings from Last 1 Encounters:  06/11/20 28.16 kg/m     Chronic disease screening Blood pressure monitoring:  BP Readings from Last 3 Encounters:  06/11/20 100/60  05/21/20 118/82  03/06/20 102/64     Lipid Monitoring: Indication for screening: age >58, obesity, diabetes, family hx, CV risk factors.  Lipid screening: Yes  Lab Results  Component Value Date   CHOL 162 04/15/2018   HDL 43 (L) 04/15/2018   LDLCALC 105 (H) 04/15/2018   TRIG 54 04/15/2018   CHOLHDL 3.8 04/15/2018     Diabetes Screening: age  >29, overweight, family hx, PCOS, hx of gestational diabetes, at risk ethnicity, elevated blood pressure >135/80.  Diabetes Screening screening: Yes  No results found for: HGBA1C    Past Medical History:  Diagnosis Date  . Allergy   . IBS (irritable bowel syndrome)   . Otitis media     Past Surgical History:  Procedure Laterality Date  . COLONOSCOPY  05/21/2020  . TYMPANOTOMY    . UPPER GASTROINTESTINAL ENDOSCOPY  05/21/2020    Prior to Admission medications   Not on File    Allergies  Allergen Reactions  . Fish Allergy Nausea And Vomiting  . Food Nausea And Vomiting    Coconut, fish, mushrooms  . Sulfa Antibiotics     Unknown reaction.   . Elemental Sulfur Rash    Gynecologic History: No LMP recorded.  Obstetric History: No obstetric history on file.  Social History   Socioeconomic History  . Marital status: Single    Spouse name: Not on file  . Number of children: 0  . Years of education: Not on file  . Highest education level: Not on file  Occupational History  . Occupation: CNA    Employer: [employer]  Tobacco Use  . Smoking status: Never Smoker  . Smokeless tobacco: Never Used  Vaping Use  . Vaping Use: Never used  Substance and Sexual Activity  . Alcohol use: No  . Drug use: No  . Sexual activity: Not Currently  Other Topics Concern  . Not on file  Social History Narrative  . Not on file   Social Determinants of Health   Financial Resource Strain: Not on file  Food Insecurity: Not on file  Transportation Needs: Not on file  Physical Activity: Not on file  Stress: Not on file  Social Connections: Not on file  Intimate Partner Violence: Not on file    Family History  Problem Relation Age of Onset  . Breast cancer Maternal Aunt        age unknown  . Dementia Maternal Grandmother   . Hyperlipidemia Maternal Grandfather   . Kidney disease Maternal Grandfather   . Celiac disease Brother   . Breast cancer Other        great  aunt   . Breast cancer Maternal Great-grandmother   . Diabetes Maternal Great-grandmother   . Liver cancer Maternal Great-grandfather   . Prostate cancer Maternal Great-grandfather   . Esophageal cancer Neg Hx   . Rectal cancer Neg Hx   . Colon cancer Neg Hx   . Colon polyps Neg Hx   . Stomach cancer Neg Hx     Review of Systems  Constitutional: Negative for chills and fever.  HENT: Negative for congestion and sore throat.   Eyes: Negative for blurred vision and double vision.  Respiratory: Negative for shortness of breath.   Cardiovascular: Negative for chest pain.  Gastrointestinal: Negative for heartburn, nausea and vomiting.  Genitourinary: Negative.   Musculoskeletal: Negative.  Negative for myalgias.  Skin: Negative for rash.  Neurological: Negative for dizziness and headaches.  Endo/Heme/Allergies: Does not bruise/bleed easily.  Psychiatric/Behavioral: Negative for depression. The patient is not nervous/anxious.      Physical Exam BP 100/60   Pulse 65   Temp (!) 97.4 F (36.3 C) (Temporal)   Ht 5' 5"  (1.651 m)   Wt 169 lb 4 oz (76.8 kg)   SpO2 98%   BMI 28.16 kg/m    BP Readings from Last 3 Encounters:  06/11/20 100/60  05/21/20 118/82  03/06/20 102/64    Wt Readings from Last 3 Encounters:  06/11/20 169 lb 4 oz (76.8 kg)  05/21/20 182 lb (82.6 kg)  03/06/20 182 lb 6.4 oz (82.7 kg)     Physical Exam Constitutional:      General: She is not in acute distress.    Appearance: She is well-developed. She is not diaphoretic.  HENT:     Head: Normocephalic and atraumatic.     Right Ear: External ear normal.     Left Ear: External ear normal.     Nose: Nose normal.  Eyes:     General: No scleral icterus.    Conjunctiva/sclera: Conjunctivae normal.  Cardiovascular:     Rate and Rhythm: Normal rate and regular rhythm.     Heart sounds: No murmur heard.   Pulmonary:     Effort: Pulmonary effort is normal. No respiratory distress.     Breath sounds:  Normal breath sounds. No wheezing.  Abdominal:     General: Bowel sounds are normal. There is no distension.     Palpations: Abdomen is soft. There is no mass.     Tenderness: There is no abdominal tenderness. There is no guarding or rebound.  Musculoskeletal:        General: Normal range of motion.     Cervical back: Neck  supple.  Lymphadenopathy:     Cervical: No cervical adenopathy.  Skin:    General: Skin is warm and dry.     Capillary Refill: Capillary refill takes less than 2 seconds.  Neurological:     Mental Status: She is alert and oriented to person, place, and time.     Deep Tendon Reflexes: Reflexes normal.  Psychiatric:        Behavior: Behavior normal.       Results: Depression screen [facility] 2/9 06/11/2020 06/21/2019 04/15/2018  Decreased Interest 0 0 0  Down, Depressed, Hopeless 0 0 0  PHQ - 2 Score 0 0 0  Altered sleeping - - 0  Tired, decreased energy - - 0  Change in appetite - - 0  Feeling bad or failure about yourself  - - 0  Trouble concentrating - - 0  Moving slowly or fidgety/restless - - 0  Suicidal thoughts - - 0  PHQ-9 Score - - 0  Difficult doing work/chores - - Not difficult at all      Assessment: 25 y.o. No obstetric history on file. female here for routine annual examination.  Plan: Problem List Items Addressed This Visit   None   Visit Diagnoses    Annual physical exam    -  Primary       Screening: -- Blood pressure screen normal -- cholesterol screening: not due for screening -- Weight screening: normal -- Diabetes Screening: not due for screening -- Nutrition: encouraged healthy diet   Psych -- Depression screening (PHQ-9):  [place] Office Visit from 04/15/2018 in [place] at [facility]  PHQ-9 Total Score 0       Safety -- tobacco screening: not using -- alcohol screening:  low-risk usage. -- no evidence of domestic violence or intimate partner violence. -- STD screening: gonorrhea/chlamydia NAAT  (Recommended for <24 years) not collected per patient request.   Cancer Screening -- pap smear not collected per ASCCP guidelines -- family history of breast cancer screening: done, 2 distant relatives, not high risk   Immunizations Immunization History  Administered Date(s) Administered  . DTaP 11/15/1996, 01/16/1997, 04/19/1997, 12/18/1997  . Hepatitis B 06-27-96, 10/12/1996, 06/14/1997  . HiB (PRP-OMP) 11/15/1996, 01/16/1997, 12/18/1997  . IPV 11/15/1996, 01/16/1997, 09/13/1997  . Influenza Inj Mdck Quad Pf 12/16/2016, 11/23/2017  . Influenza,inj,Quad PF,6+ Mos 10/25/2015, 11/23/2017  . MMR 09/13/1997  . PFIZER Comirnaty(Gray Top)Covid-19 Tri-Sucrose Vaccine 10/19/2019, 11/11/2019  . Tdap 09/13/2007, 12/19/2014  . Varicella 09/13/1997    -- flu vaccine up to date -- TDAP q10 years up to date -- Covid-19 Vaccine up to date  Encouraged regular vision and dental screening. Encouraged healthy exercise and diet.   Lesleigh Noe

## 2020-06-11 NOTE — Patient Instructions (Addendum)
We can try and get records from [place] -- will MMR, Varicella, Tdap and TB testing  Continue to eat healthy and exercise

## 2020-06-14 ENCOUNTER — Encounter: Payer: Self-pay | Admitting: Family Medicine

## 2020-06-19 ENCOUNTER — Encounter: Payer: Self-pay | Admitting: Family Medicine

## 2020-06-20 DIAGNOSIS — Z201 Contact with and (suspected) exposure to tuberculosis: Secondary | ICD-10-CM

## 2020-06-24 ENCOUNTER — Other Ambulatory Visit: Payer: 59

## 2020-06-24 ENCOUNTER — Other Ambulatory Visit: Payer: Self-pay

## 2020-06-24 ENCOUNTER — Other Ambulatory Visit (INDEPENDENT_AMBULATORY_CARE_PROVIDER_SITE_OTHER): Payer: 59

## 2020-06-24 DIAGNOSIS — Z201 Contact with and (suspected) exposure to tuberculosis: Secondary | ICD-10-CM

## 2020-06-24 DIAGNOSIS — Z23 Encounter for immunization: Secondary | ICD-10-CM | POA: Diagnosis not present

## 2020-06-26 ENCOUNTER — Encounter: Payer: Self-pay | Admitting: Family Medicine

## 2020-06-26 LAB — QUANTIFERON-TB GOLD PLUS
Mitogen-NIL: >10 IU/mL
NIL: 0.04 IU/mL
QuantiFERON-TB Gold Plus: NEGATIVE
TB1-NIL: 0.14 IU/mL
TB2-NIL: 0.14 IU/mL

## 2020-07-03 ENCOUNTER — Ambulatory Visit: Payer: 59 | Admitting: Gastroenterology

## 2020-07-03 ENCOUNTER — Encounter: Payer: Self-pay | Admitting: Gastroenterology

## 2020-07-03 VITALS — BP 110/80 | HR 68 | Ht 65.0 in | Wt 166.0 lb

## 2020-07-03 DIAGNOSIS — K2990 Gastroduodenitis, unspecified, without bleeding: Secondary | ICD-10-CM | POA: Diagnosis not present

## 2020-07-03 DIAGNOSIS — K5902 Outlet dysfunction constipation: Secondary | ICD-10-CM

## 2020-07-03 DIAGNOSIS — K2970 Gastritis, unspecified, without bleeding: Secondary | ICD-10-CM

## 2020-07-03 DIAGNOSIS — K581 Irritable bowel syndrome with constipation: Secondary | ICD-10-CM | POA: Diagnosis not present

## 2020-07-03 MED ORDER — DICYCLOMINE HCL 20 MG PO TABS: 20.0000 mg | ORAL_TABLET | Freq: Four times a day (QID) | ORAL | 2 refills | Status: DC | PRN

## 2020-07-03 NOTE — Progress Notes (Signed)
Referring Provider: Jearld Fenton, NP Primary Care Physician:  Jearld Fenton, NP  Chief complaint:  Constipation   IMPRESSION:  Chronic gastritis without H pylori    - avoiding NSAIDs Constipation due to type I pelvic dyssynergia on anorectal manometry performed at [place]    - 1 session of PT was not beneficial and cost prohibitive    - no biofeedback performed    - did not find recent PT helpful    - squatty potty is improving symptoms Constipation predominant IBS overlap    - likely IBS overlap    - TSH, IgA, TTGA 12/10/16    - anorectal manometry performed at [facility] 01/13/17    - Test for SIBO negative at [facility]    - failed trials of: laxatives, Amitiza, Miralax, magnesium citrate, Linzess, Trulance Elevated calcium, normal on repeat testing Multiple family members with liver cancer (maternal grandfather, maternal great grandmother, and maternal aunt)  PLAN: - Continue low FODMAP diet with goal of reintroducing one food every couple of weeks - Bentyl 20 mg QID PRN  - High fiber diet not recommended - as it is expected to exacerbate her symptoms - Avoid NSAIDs - Follow-up PRN  HPI: Shannon Mccoy is a 25 y.o. female who returns in follow-up after endoscopic evaluation. She was last seen in the office 03/06/20 and had endoscopic evaluation 05/21/20. The interval history is obtained through the patient and review of her electronic health record. She now works as a Quarry manager at [employer].  Ongoing constipation with a sense of incomplete evacuation, abdominal pain, early satiety and bloating despite using Miralax in her coffee every morning. Having a bowel movements every 2-4 days. Stool consistency varies. Severe episodes of abdominal pain have improved since she started working at [employer] last year. She has reduced red meat consumption and tried a dairy-free diet without a change in her symptoms.   EGD 05/21/20 showed mild chronic gastritis and was otherwise normal. Duodenal  biopsies were normal.  Colonoscopy performed to distal ascending colon due to looping was normal.   She returns in follow-up. FODMAP diet, avoiding dairy, and exercise were controlling her constipation better than any medications in the past. She is having one formed bowel movement daily using Miralax daily and increasing her daily water consumption.  She saw the physical therapist who counseled her on diaphragmatic. But, she finds the squatty potty more helpful.   Prior GI imaging: CT abd/pelvis with contrast 10/22/15: no GI tract abnormalities except for a fairly large amount of stool within the colon  Labs 04/15/18: calcium elevated at 10.4 (had been repeatedly normal at 9.4-9.7 prior to that time) Labs 04/15/18: normal CBC except for platelets of 498 Labs 08/02/18: normal TSH, ESR, CRP, Giardia, and fecal calprotectin Labs 03/06/20: normal CMP including calcium of 9.4, meat allergy testing negative, TSH 2.57   Past Medical History:  Diagnosis Date  . Allergy   . IBS (irritable bowel syndrome)   . Otitis media     Past Surgical History:  Procedure Laterality Date  . COLONOSCOPY  05/21/2020  . TYMPANOTOMY    . UPPER GASTROINTESTINAL ENDOSCOPY  05/21/2020    Current Outpatient Medications  Medication Sig Dispense Refill  . polyethylene glycol (MIRALAX / GLYCOLAX) 17 g packet Take 17 g by mouth daily.     No current facility-administered medications for this visit.    Allergies as of 07/03/2020 - Review Complete 07/03/2020  Allergen Reaction Noted  . Fish allergy Nausea And  Vomiting 04/04/2011  . Food Nausea And Vomiting 04/04/2011  . Sulfa antibiotics  10/22/2015  . Elemental sulfur Rash 12/10/2016    Family History  Problem Relation Age of Onset  . Breast cancer Maternal Aunt        age unknown  . Dementia Maternal Grandmother   . Hyperlipidemia Maternal Grandfather   . Kidney disease Maternal Grandfather   . Celiac disease Brother   . Breast cancer Other         great aunt   . Breast cancer Maternal Great-grandmother   . Diabetes Maternal Great-grandmother   . Liver cancer Maternal Great-grandfather   . Prostate cancer Maternal Great-grandfather   . Esophageal cancer Neg Hx   . Rectal cancer Neg Hx   . Colon cancer Neg Hx   . Colon polyps Neg Hx   . Stomach cancer Neg Hx      Physical Exam: Vital signs were reviewed. General:   Alert, well-nourished, pleasant and cooperative in NAD.  No pallor. Head:  Normocephalic and atraumatic.  No alopecia. Eyes:  Sclera clear, no icterus.   Conjunctiva pink. Abdomen:  Soft, nontender but she localizes pain to the left lower quadrant, normal bowel sounds. No rebound or guarding. No hepatosplenomegaly Neurologic:  Alert and  oriented x4;  grossly nonfocal Skin:  No rash or bruise. Psych:  Alert and cooperative. Normal mood and affect.   Najae Filsaime L. Tarri Glenn, MD, MPH [facility] 07/03/2020, 8:50 AM

## 2020-07-03 NOTE — Patient Instructions (Addendum)
It was a pleasure to see you today. Based on our discussion, I am providing you with my recommendations below:  RECOMMENDATION(S):   Continue low FODMAP diet with a goal to introduce one food every couple of weeks  Continue to avoid NSAIDs  DO NOT consider high fiber diet as this will only exacerbate your symptoms  FOLLOW UP:  . I would like for you to follow up with me as needed. Please call the office at [phone] when you are needing to schedule your appointment.  BMI:  . If you are age 54 or younger, your body mass index should be between 19-25. Your Body mass index is 27.62 kg/m. If this is out of the aformentioned range listed, please consider follow up with your Primary Care Provider.   Thank you for trusting me with your gastrointestinal care!    Tressia Danas, MD, MPH

## 2020-09-18 ENCOUNTER — Other Ambulatory Visit: Payer: Self-pay

## 2020-09-18 ENCOUNTER — Ambulatory Visit: Payer: 59 | Admitting: Internal Medicine

## 2020-09-18 ENCOUNTER — Encounter: Payer: Self-pay | Admitting: Internal Medicine

## 2020-09-18 DIAGNOSIS — E663 Overweight: Secondary | ICD-10-CM

## 2020-09-18 DIAGNOSIS — Z6827 Body mass index (BMI) 27.0-27.9, adult: Secondary | ICD-10-CM | POA: Diagnosis not present

## 2020-09-18 DIAGNOSIS — K581 Irritable bowel syndrome with constipation: Secondary | ICD-10-CM

## 2020-09-18 NOTE — Assessment & Plan Note (Signed)
Encourage diet and exercise for weight loss 

## 2020-09-18 NOTE — Patient Instructions (Signed)

## 2020-09-18 NOTE — Assessment & Plan Note (Signed)
Continue low FODMAP diet Continue Dicyclomine and MiraLAX She will continue to see GI, will follow

## 2020-09-18 NOTE — Progress Notes (Signed)
Subjective:    Patient ID: Shannon Mccoy, female    DOB: November 16, 1995, 24 y.o.   MRN: [id number]  HPI  Patient presents the clinic today for follow-up of chronic conditions.  IBS: She reports mainly constipation.  She was placed on a low FODMAP diet by her gastroenterologist.  She is taking Dicyclomine and MiraLAX as prescribed. She had an upper GI and colonoscopy 04/2020. She follows with GI.  She also reports swelling to the right side of her neck.  She thinks this is her thyroid.  The swelling is not causing her any pain or discomfort.  She denies difficulty swallowing, sore throat or cough.  She denies any injury to that area.  She had a normal TSH 02/2020.  US thyroid from 2019 reviewed-normal.  Review of Systems  Past Medical History:  Diagnosis Date   Allergy    IBS (irritable bowel syndrome)    Otitis media     Current Outpatient Medications  Medication Sig Dispense Refill   dicyclomine (BENTYL) 20 MG tablet Take 1 tablet (20 mg total) by mouth 4 (four) times daily as needed for spasms. 30 tablet 2   polyethylene glycol (MIRALAX / GLYCOLAX) 17 g packet Take 17 g by mouth daily.     No current facility-administered medications for this visit.    Allergies  Allergen Reactions   Fish Allergy Nausea And Vomiting   Food Nausea And Vomiting    Coconut, fish, mushrooms   Sulfa Antibiotics     Unknown reaction.    Elemental Sulfur Rash    Family History  Problem Relation Age of Onset   Breast cancer Maternal Aunt        age unknown   Dementia Maternal Grandmother    Hyperlipidemia Maternal Grandfather    Kidney disease Maternal Grandfather    Celiac disease Brother    Breast cancer Other        great aunt    Breast cancer Maternal Great-grandmother    Diabetes Maternal Great-grandmother    Liver cancer Maternal Great-grandfather    Prostate cancer Maternal Great-grandfather    Esophageal cancer Neg Hx    Rectal cancer Neg Hx    Colon cancer Neg Hx     Colon polyps Neg Hx    Stomach cancer Neg Hx     Social History   Socioeconomic History   Marital status: Single    Spouse name: Not on file   Number of children: 0   Years of education: Not on file   Highest education level: Not on file  Occupational History   Occupation: CNA    Employer: [employer]  Tobacco Use   Smoking status: Never   Smokeless tobacco: Never  Vaping Use   Vaping Use: Never used  Substance and Sexual Activity   Alcohol use: No   Drug use: No   Sexual activity: Not Currently  Other Topics Concern   Not on file  Social History Narrative   Not on file   Social Determinants of Health   Financial Resource Strain: Not on file  Food Insecurity: Not on file  Transportation Needs: Not on file  Physical Activity: Not on file  Stress: Not on file  Social Connections: Not on file  Intimate Partner Violence: Not on file     Constitutional: Denies fever, malaise, fatigue, headache or abrupt weight changes.  HEENT: Denies eye pain, eye redness, ear pain, ringing in the ears, wax buildup, runny nose, nasal congestion, bloody nose,  or sore throat. Respiratory: Denies difficulty breathing, shortness of breath, cough or sputum production.   Cardiovascular: Denies chest pain, chest tightness, palpitations or swelling in the hands or feet.  Gastrointestinal: Patient reports constipation.  Denies abdominal pain, bloating, diarrhea or blood in the stool.  GU: Denies urgency, frequency, pain with urination, burning sensation, blood in urine, odor or discharge. Musculoskeletal: Patient reports swelling to the right side of her neck.  Denies decrease in range of motion, difficulty with gait, muscle pain or joint pain.  Skin: Denies redness, rashes, lesions or ulcercations.  Neurological: Denies dizziness, difficulty with memory, difficulty with speech or problems with balance and coordination.  Psych: Denies anxiety, depression, SI/HI.  No other specific  complaints in a complete review of systems (except as listed in HPI above).      Objective:   Physical Exam  BP 113/68 (BP Location: Right Arm, Patient Position: Sitting, Cuff Size: Normal)   Pulse 64   Temp 97.7 F (36.5 C) (Temporal)   Resp 16   Ht 5\' 5"  (1.651 m)   Wt 165 lb 12.8 oz (75.2 kg)   SpO2 100%   BMI 27.59 kg/m   Wt Readings from Last 3 Encounters:  07/03/20 166 lb (75.3 kg)  06/11/20 169 lb 4 oz (76.8 kg)  05/21/20 182 lb (82.6 kg)    General: Appears her stated age, overweight, in NAD. Skin: Warm, dry and intact. No rashes noted. HEENT: Head: normal shape and size; Eyes: sclera white and EOMs intact;  Neck:  Neck supple, trachea midline. No masses, lumps or thyromegaly present.  Cardiovascular: Normal rate and rhythm. S1,S2 noted.  No murmur, rubs or gallops noted.  Pulmonary/Chest: Normal effort and positive vesicular breath sounds. No respiratory distress. No wheezes, rales or ronchi noted.  Abdomen: Soft and nontender.  Hypoactive bowel sounds. No distention or masses noted.  Musculoskeletal: She has slight fullness to the right lateral aspect of her neck however this seems to be asymmetrical subcutaneous tissue.  No difficulty with gait.  Neurological: Alert and oriented.  Psychiatric: Mood and affect normal. Behavior is normal. Judgment and thought content normal.    BMET    Component Value Date/Time   NA 137 03/06/2020 0949   K 3.7 03/06/2020 0949   CL 103 03/06/2020 0949   CO2 28 03/06/2020 0949   GLUCOSE 83 03/06/2020 0949   BUN 11 03/06/2020 0949   CREATININE 0.78 03/06/2020 0949   CREATININE 0.71 04/15/2018 1523   CALCIUM 9.4 03/06/2020 0949   GFRNONAA >60 10/22/2015 1528   GFRAA >60 10/22/2015 1528    Lipid Panel     Component Value Date/Time   CHOL 162 04/15/2018 1523   TRIG 54 04/15/2018 1523   HDL 43 (L) 04/15/2018 1523   CHOLHDL 3.8 04/15/2018 1523   VLDL 11.2 01/21/2016 1453   LDLCALC 105 (H) 04/15/2018 1523    CBC     Component Value Date/Time   WBC 11.5 (H) 03/06/2020 0949   RBC 4.79 03/06/2020 0949   HGB 13.6 03/06/2020 0949   HCT 40.4 03/06/2020 0949   PLT 400.0 03/06/2020 0949   MCV 84.4 03/06/2020 0949   MCH 29.0 04/15/2018 1523   MCHC 33.7 03/06/2020 0949   RDW 12.6 03/06/2020 0949   LYMPHSABS 1.7 05/20/2010 1353   MONOABS 0.9 05/20/2010 1353   EOSABS 0.1 05/20/2010 1353   BASOSABS 0.0 05/20/2010 1353    Hgb A1C No results found for: HGBA1C         Assessment &  Plan:   Swelling of Neck:  Exam benign I do not feel this is related to her thyroid She reports the swelling comes and goes, without pain or discomfort My recommendation would be monitoring at this time  RTC in 1 year for her annual exam Nicki Reaper, NP This visit occurred during the SARS-CoV-2 public health emergency.  Safety protocols were in place, including screening questions prior to the visit, additional usage of staff PPE, and extensive cleaning of exam room while observing appropriate contact time as indicated for disinfecting solutions.

## 2020-11-08 ENCOUNTER — Encounter: Payer: Self-pay | Admitting: Internal Medicine

## 2020-11-08 DIAGNOSIS — N6310 Unspecified lump in the right breast, unspecified quadrant: Secondary | ICD-10-CM | POA: Diagnosis not present

## 2020-11-08 NOTE — Telephone Encounter (Signed)
Pt was called and notified that since she has not been seen in the office here for several months unfortunately  we cannot write her a note for work. Pt was offered to schedule and office visit although pt stated that she would would contact her PCP.

## 2020-11-20 ENCOUNTER — Encounter: Payer: Self-pay | Admitting: Internal Medicine

## 2020-11-26 ENCOUNTER — Encounter: Payer: Self-pay | Admitting: Internal Medicine

## 2020-11-26 ENCOUNTER — Telehealth: Payer: 59 | Admitting: Internal Medicine

## 2020-11-26 ENCOUNTER — Other Ambulatory Visit: Payer: Self-pay

## 2020-11-26 DIAGNOSIS — R519 Headache, unspecified: Secondary | ICD-10-CM | POA: Diagnosis not present

## 2020-11-26 MED ORDER — BUTALBITAL-APAP-CAFFEINE 50-325-40 MG PO TABS: 1.0000 | ORAL_TABLET | Freq: Four times a day (QID) | ORAL | 0 refills | Status: DC | PRN

## 2020-11-26 MED ORDER — AMITRIPTYLINE HCL 10 MG PO TABS: 10.0000 mg | ORAL_TABLET | Freq: Every day | ORAL | 0 refills | Status: DC

## 2020-11-26 NOTE — Patient Instructions (Signed)
°Tension Headache, Adult °A tension headache is a feeling of pain, pressure, or aching in the head. It is often felt over the front and sides of the head. Tension headaches can last from 30 minutes to several days. °What are the causes? °The cause of this condition is not known. Sometimes, tension headaches are brought on by stress, worry (anxiety), or depression. Other things that may set them off include: °Alcohol. °Too much caffeine or caffeine withdrawal. °Colds, flu, or sinus infections. °Dental problems. This can include clenching your teeth. °Being tired. °Holding your head and neck in the same position for a long time, such as while using a computer. °Smoking. °Arthritis in the neck. °What are the signs or symptoms? °Feeling pressure around the head. °A dull ache in the head. °Pain over the front and sides of the head. °Feeling sore or tender in the muscles of the head, neck, and shoulders. °How is this treated? °This condition may be treated with lifestyle changes and with medicines that help relieve symptoms. °Follow these instructions at home: °Managing pain °Take over-the-counter and prescription medicines only as told by your doctor. °When you have a headache, lie down in a dark, quiet room. °If told, put ice on your head and neck. To do this: °Put ice in a plastic bag. °Place a towel between your skin and the bag. °Leave the ice on for 20 minutes, 2-3 times a day. °Take off the ice if your skin turns bright red. This is very important. If you cannot feel pain, heat, or cold, you have a greater risk of damage to the area. °If told, put heat on the back of your neck. Do this as often as told by your doctor. Use the heat source that your doctor recommends, such as a moist heat pack or a heating pad. °Place a towel between your skin and the heat source. °Leave the heat on for 20-30 minutes. °Take off the heat if your skin turns bright red. This is very important. If you cannot feel pain, heat, or cold,  you have a greater risk of getting burned. °Eating and drinking °Eat meals on a regular schedule. °If you drink alcohol: °Limit how much you have to: °0-1 drink a day for women who are not pregnant. °0-2 drinks a day for men. °Know how much alcohol is in your drink. In the [place] one drink equals one 12 oz bottle of beer (355 mL), one 5 oz glass of wine (148 mL), or one 1½ oz glass of hard liquor (44 mL). °Drink enough fluid to keep your pee (urine) pale yellow. °Do not use a lot of caffeine, or stop using caffeine. °Lifestyle °Get 7-9 hours of sleep each night. Or get the amount of sleep that your doctor tells you to. °At bedtime, keep computers, phones, and tablets out of your room. °Find ways to lessen your stress. This may include: °Exercise. °Deep breathing. °Yoga. °Listening to music. °Thinking positive thoughts. °Sit up straight. Try to relax your muscles. °Do not smoke or use any products that contain nicotine or tobacco. If you need help quitting, ask your doctor. °General instructions ° °Avoid things that can bring on headaches. Keep a headache journal to see what may bring on headaches. For example, write down: °What you eat and drink. °How much sleep you get. °Any change to your diet or medicines. °Keep all follow-up visits. °Contact a doctor if: °Your headache does not get better. °Your headache comes back. °You have a headache, and sounds,   light, or smells bother you. °You feel like you may vomit, or you vomit. °Your stomach hurts. °Get help right away if: °You all of a sudden get a very bad headache with any of these things: °A stiff neck. °Feeling like you may vomit. °Vomiting. °Feeling mixed up (confused). °Feeling weak in one part or one side of your body. °Having trouble seeing or speaking, or both. °Feeling short of breath. °A rash. °Feeling very sleepy. °Pain in your eye or ear. °Trouble walking or balancing. °Feeling like you will faint, or you faint. °Summary °A tension headache is pain,  pressure, or aching in your head. °Tension headaches can last from 30 minutes to several days. °Lifestyle changes and medicines may help relieve pain. °This information is not intended to replace advice given to you by your health care provider. Make sure you discuss any questions you have with your health care provider. °Document Revised: 11/09/2019 Document Reviewed: 11/09/2019 °Elsevier Patient Education © 2022 [employer] Inc. ° °

## 2020-11-26 NOTE — Progress Notes (Signed)
Virtual Visit via Video Note  I connected with Cordia Miklos on 11/26/20 at  3:00 PM EDT by a video enabled telemedicine application and verified that I am speaking with the correct person using two identifiers.  Location: Patient: Home Provider: Office  Persons participating in this video call: Nicki Reaper, NP and [employer].   I discussed the limitations of evaluation and management by telemedicine and the availability of in person appointments. The patient expressed understanding and agreed to proceed.  History of Present Illness:  Patient reports frequent headaches.  She reports this started 1 month ago.  The headaches are occurring about every other day.  The pain is located in her temples and radiates around to the back of her head.  She describes the pain as throbbing.  She denies dizziness, visual changes, sensitivity to light or sound, nausea or vomiting.  She denies neck pain.  She does feel like she is sleeping well.  She does report some stress with working part-time and being in school.  She has been reading a lot of textbooks and staring at a computer for long periods of time.  She does wear readers.  Her last eye exam was at the beginning of 2022.  She has tried Ibuprofen and Tylenol OTC with minimal relief of symptoms.   Past Medical History:  Diagnosis Date   Allergy    IBS (irritable bowel syndrome)    Otitis media     Current Outpatient Medications  Medication Sig Dispense Refill   dicyclomine (BENTYL) 20 MG tablet Take 1 tablet (20 mg total) by mouth 4 (four) times daily as needed for spasms. 30 tablet 2   polyethylene glycol (MIRALAX / GLYCOLAX) 17 g packet Take 17 g by mouth daily.     No current facility-administered medications for this visit.    Allergies  Allergen Reactions   Fish Allergy Nausea And Vomiting   Food Nausea And Vomiting    Coconut, fish, mushrooms   Lactose Intolerance (Gi)     bloating   Sulfa Antibiotics     Unknown  reaction.    Elemental Sulfur Rash    Family History  Problem Relation Age of Onset   Breast cancer Maternal Aunt        age unknown   Dementia Maternal Grandmother    Hyperlipidemia Maternal Grandfather    Kidney disease Maternal Grandfather    Celiac disease Brother    Breast cancer Other        great aunt    Breast cancer Maternal Great-grandmother    Diabetes Maternal Great-grandmother    Liver cancer Maternal Great-grandfather    Prostate cancer Maternal Great-grandfather    Esophageal cancer Neg Hx    Rectal cancer Neg Hx    Colon cancer Neg Hx    Colon polyps Neg Hx    Stomach cancer Neg Hx     Social History   Socioeconomic History   Marital status: Single    Spouse name: Not on file   Number of children: 0   Years of education: Not on file   Highest education level: Not on file  Occupational History   Occupation: CNA    Employer: [employer]  Tobacco Use   Smoking status: Never   Smokeless tobacco: Never  Vaping Use   Vaping Use: Never used  Substance and Sexual Activity   Alcohol use: No   Drug use: No   Sexual activity: Not Currently  Other Topics Concern  Not on file  Social History Narrative   Not on file   Social Determinants of Health   Financial Resource Strain: Not on file  Food Insecurity: Not on file  Transportation Needs: Not on file  Physical Activity: Not on file  Stress: Not on file  Social Connections: Not on file  Intimate Partner Violence: Not on file     Constitutional: Patient reports headaches.  Denies fever, malaise, fatigue, or abrupt weight changes.  HEENT: Denies eye pain, eye redness, ear pain, ringing in the ears, wax buildup, runny nose, nasal congestion, bloody nose, or sore throat. Respiratory: Denies difficulty breathing, shortness of breath, cough or sputum production.   Cardiovascular: Denies chest pain, chest tightness, palpitations or swelling in the hands or feet.  Musculoskeletal: Denies decrease  in range of motion, difficulty with gait, muscle pain or joint pain and swelling.  Skin: Denies redness, rashes, lesions or ulcercations.  Neurological: Denies dizziness, difficulty with memory, difficulty with speech or problems with balance and coordination.  Psych: Patient reports stress.  Denies anxiety, depression, SI/HI.  No other specific complaints in a complete review of systems (except as listed in HPI above).    Observations/Objective: There were no vitals taken for this visit.  Wt Readings from Last 3 Encounters:  09/18/20 165 lb 12.8 oz (75.2 kg)  07/03/20 166 lb (75.3 kg)  06/11/20 169 lb 4 oz (76.8 kg)    General: Appears her stated age,  in NAD. HEENT: Head: normal shape and size;  Pulmonary/Chest: Normal effort. No respiratory distress.  Neurological: Alert and oriented. Cranial nerves II-XII grossly intact. Coordination normal.    BMET    Component Value Date/Time   NA 137 03/06/2020 0949   K 3.7 03/06/2020 0949   CL 103 03/06/2020 0949   CO2 28 03/06/2020 0949   GLUCOSE 83 03/06/2020 0949   BUN 11 03/06/2020 0949   CREATININE 0.78 03/06/2020 0949   CREATININE 0.71 04/15/2018 1523   CALCIUM 9.4 03/06/2020 0949   GFRNONAA >60 10/22/2015 1528   GFRAA >60 10/22/2015 1528    Lipid Panel     Component Value Date/Time   CHOL 162 04/15/2018 1523   TRIG 54 04/15/2018 1523   HDL 43 (L) 04/15/2018 1523   CHOLHDL 3.8 04/15/2018 1523   VLDL 11.2 01/21/2016 1453   LDLCALC 105 (H) 04/15/2018 1523    CBC    Component Value Date/Time   WBC 11.5 (H) 03/06/2020 0949   RBC 4.79 03/06/2020 0949   HGB 13.6 03/06/2020 0949   HCT 40.4 03/06/2020 0949   PLT 400.0 03/06/2020 0949   MCV 84.4 03/06/2020 0949   MCH 29.0 04/15/2018 1523   MCHC 33.7 03/06/2020 0949   RDW 12.6 03/06/2020 0949   LYMPHSABS 1.7 05/20/2010 1353   MONOABS 0.9 05/20/2010 1353   EOSABS 0.1 05/20/2010 1353   BASOSABS 0.0 05/20/2010 1353    Hgb A1C No results found for:  HGBA1C      Assessment and Plan:   Follow Up Instructions:    I discussed the assessment and treatment plan with the patient. The patient was provided an opportunity to ask questions and all were answered. The patient agreed with the plan and demonstrated an understanding of the instructions.   The patient was advised to call back or seek an in-person evaluation if the symptoms worsen or if the condition fails to improve as anticipated.     Nicki Reaper, NP

## 2020-11-26 NOTE — Assessment & Plan Note (Signed)
Likely tension related Rx for Amitriptyline 10 mg nightly-sedation caution given Rx for Fioricet to take as needed for breakthrough Encourage stress reduction techniques May need to follow-up with her eye doctor regarding eyestrain  Update me in 2 weeks via MyChart and let me know how you are doing

## 2020-12-18 ENCOUNTER — Other Ambulatory Visit: Payer: Self-pay | Admitting: Internal Medicine

## 2020-12-19 ENCOUNTER — Encounter: Payer: Self-pay | Admitting: Internal Medicine

## 2020-12-19 DIAGNOSIS — R519 Headache, unspecified: Secondary | ICD-10-CM

## 2020-12-19 NOTE — Telephone Encounter (Signed)
Requested medications are due for refill today.  yes  Requested medications are on the active medications list.  yes  Last refill. 11/26/2020  Future visit scheduled.   no  Notes to clinic.  Pt was to update PCP regarding this medication and has not.

## 2020-12-24 ENCOUNTER — Encounter: Payer: Self-pay | Admitting: Neurology

## 2021-02-04 NOTE — Progress Notes (Signed)
NEUROLOGY CONSULTATION NOTE  Shannon Mccoy MRN: [id number] DOB: Aug 16, 1995  Referring provider: Webb Silversmith, NP Primary care provider: Webb Silversmith, NP  Reason for consult:  headache  Assessment/Plan:   Chronic migraine without aura, without status migrainosus, not intractable   Migraine prevention:  start propranolol ER 60mg  daily.  If no improvement in 4 weeks, would increase to 80mg  daily.  Caution for drowsiness Migraine rescue:  sumatriptan 100mg  Limit use of pain relievers to no more than 2 days out of week to prevent risk of rebound or medication-overuse headache. Keep headache diary Follow up 7 months     Subjective:  Shannon Mccoy is a 25 year old female with IBS who presents for headache.  History supplemented by referring provider's note.  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  She denies nausea, vomiting, dizziness, visual disturbance, associated unilateral numbness or weakness. Duration:  1 to 2 days (rarely 3 days) Frequency:  always a daily headache.  Severe headaches occur on average 7 days a month Frequency of abortive medication: none Triggers:  stress (especially work-related stress in the ED), certain foods that trigger IBS Relieving factors:  resting in dark and quiet room Activity:  cannot function if severe.  Current NSAIDS/analgesics:  none Current triptans:  none Current ergotamine:  none Current anti-emetic:  none Current muscle relaxants:  none Current Antihypertensive medications:  none Current Antidepressant medications:  none Current Anticonvulsant medications:  none Current anti-CGRP:  none Current Vitamins/Herbal/Supplements:  none Current Antihistamines/Decongestants:  none Other therapy:  none Hormone/birth control:   none  Past NSAIDS/analgesics:  Tylenol, ibuprofen, Fioricet Past abortive triptans:  none Past abortive ergotamine:  none Past muscle relaxants:  none Past anti-emetic:  none Past antihypertensive medications:  none Past antidepressant medications:  amitriptyline 10mg  at bedtime (cannot take because 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  Drinks a lot of water.  Skips meals.  Has IBS Exercise:  gym at least 4-5 times a week Depression:  none; Anxiety:  none Other pain:  no Sleep hygiene:  Varies.  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   Family history of headache:  mother (migraines)      PAST MEDICAL HISTORY: Past Medical History:  Diagnosis Date   Allergy    IBS (irritable bowel syndrome)    Otitis media     PAST SURGICAL HISTORY: Past Surgical History:  Procedure Laterality Date   COLONOSCOPY  05/21/2020   TYMPANOTOMY     UPPER GASTROINTESTINAL ENDOSCOPY  05/21/2020    MEDICATIONS: Current Outpatient Medications on File Prior to Visit  Medication Sig Dispense Refill   amitriptyline (ELAVIL) 10 MG tablet Take 1 tablet (10 mg total) by mouth at bedtime. 30 tablet 0   butalbital-acetaminophen-caffeine (FIORICET) 50-325-40 MG tablet Take 1 tablet by mouth every 6 (six) hours as needed for headache. 14 tablet 0   dicyclomine (BENTYL) 20 MG tablet Take 1 tablet (20 mg total) by mouth 4 (four) times daily as needed for spasms. 30 tablet 2   polyethylene glycol (MIRALAX / GLYCOLAX) 17 g packet Take  17 g by mouth daily.     No current facility-administered medications on file prior to visit.    ALLERGIES: Allergies  Allergen Reactions   Fish Allergy Nausea And Vomiting   Food Nausea And Vomiting     Coconut, fish, mushrooms   Lactose Intolerance (Gi)     bloating   Sulfa Antibiotics     Unknown reaction.    Elemental Sulfur Rash    FAMILY HISTORY: Family History  Problem Relation Age of Onset   Breast cancer Maternal Aunt        age unknown   Dementia Maternal Grandmother    Hyperlipidemia Maternal Grandfather    Kidney disease Maternal Grandfather    Celiac disease Brother    Breast cancer Other        great aunt    Breast cancer Maternal Great-grandmother    Diabetes Maternal Great-grandmother    Liver cancer Maternal Great-grandfather    Prostate cancer Maternal Great-grandfather    Esophageal cancer Neg Hx    Rectal cancer Neg Hx    Colon cancer Neg Hx    Colon polyps Neg Hx    Stomach cancer Neg Hx     Objective:  Blood pressure 117/77, pulse 75, height 5\' 5"  (1.651 m), weight 185 lb 8 oz (84.1 kg), SpO2 99 %. General: No acute distress.  Patient appears well-groomed.   Head:  Normocephalic/atraumatic Eyes:  fundi examined but not visualized Neck: supple, no paraspinal tenderness, full range of motion Back: No paraspinal tenderness Heart: regular rate and rhythm Lungs: Clear to auscultation bilaterally. Vascular: No carotid bruits. Neurological Exam: Mental status: alert and oriented to person, place, and time, recent and remote memory intact, fund of knowledge intact, attention and concentration intact, speech fluent and not dysarthric, language intact. Cranial nerves: CN I: not tested CN II: pupils equal, round and reactive to light, visual fields intact CN III, IV, VI:  full range of motion, no nystagmus, no ptosis CN V: facial sensation intact. CN VII: upper and lower face symmetric CN VIII: hearing intact CN IX, X: gag intact, uvula midline CN XI: sternocleidomastoid and trapezius muscles intact CN XII: tongue midline Bulk & Tone: normal, no fasciculations. Motor:  muscle strength 5/5 throughout Sensation:  Pinprick, temperature and vibratory  sensation intact. Deep Tendon Reflexes:  2+ throughout,  toes downgoing.   Finger to nose testing:  Without dysmetria.   Heel to shin:  Without dysmetria.   Gait:  Normal station and stride.  Romberg negative.    Thank you for allowing me to take part in the care of this patient.  , DO  CC: Shon Millet, NP

## 2021-02-05 ENCOUNTER — Ambulatory Visit: Payer: 59 | Admitting: Neurology

## 2021-02-05 ENCOUNTER — Other Ambulatory Visit: Payer: Self-pay

## 2021-02-05 ENCOUNTER — Encounter: Payer: Self-pay | Admitting: Neurology

## 2021-02-05 VITALS — BP 117/77 | HR 75 | Ht 65.0 in | Wt 185.5 lb

## 2021-02-05 DIAGNOSIS — G43709 Chronic migraine without aura, not intractable, without status migrainosus: Secondary | ICD-10-CM

## 2021-02-05 MED ORDER — PROPRANOLOL HCL ER 60 MG PO CP24: 60.0000 mg | ORAL_CAPSULE | Freq: Every day | ORAL | 5 refills | Status: DC

## 2021-02-05 MED ORDER — SUMATRIPTAN SUCCINATE 100 MG PO TABS: ORAL_TABLET | ORAL | 5 refills | Status: DC

## 2021-02-05 NOTE — Patient Instructions (Addendum)
°  Start propranolol ER 60mg  daily.  Contact in 4 weeks with update and we can increase dose if needed. Take sumatriptan 100mg  at earliest onset of headache.  May repeat dose once in 2 hours if needed.  Maximum 2 tablets in 24 hours. Limit use of pain relievers to no more than 2 days out of the week.  These medications include acetaminophen, NSAIDs (ibuprofen/Advil/Motrin, naproxen/Aleve, triptans (Imitrex/sumatriptan), Excedrin, and narcotics.  This will help reduce risk of rebound headaches. Be aware of common food triggers:  - Caffeine:  coffee, black tea, cola, Mt. Dew  - Chocolate  - Dairy:  aged cheeses (brie, blue, cheddar, gouda, [place], provolone, [place], Swiss, etc), chocolate milk, buttermilk, sour cream, limit eggs and yogurt  - Nuts, peanut butter  - Alcohol  - Cereals/grains:  FRESH breads (fresh bagels, sourdough, doughnuts), yeast productions  - Processed/canned/aged/cured meats (pre-packaged deli meats, hotdogs)  - MSG/glutamate:  soy sauce, flavor enhancer, pickled/preserved/marinated foods  - Sweeteners:  aspartame (Equal, Nutrasweet).  Sugar and Splenda are okay  - Vegetables:  legumes (lima beans, lentils, snow peas, fava beans, pinto peans, peas, garbanzo beans), sauerkraut, onions, olives, pickles  - Fruit:  avocados, bananas, citrus fruit (orange, lemon, grapefruit), mango  - Other:  Frozen meals, macaroni and cheese Routine exercise Stay adequately hydrated (aim for 64 oz water daily) Keep headache diary Maintain proper stress management Maintain proper sleep hygiene Do not skip meals Consider supplements:  magnesium citrate 400mg  daily, riboflavin 400mg  daily, coenzyme Q10 100mg  three times daily.

## 2021-02-07 ENCOUNTER — Encounter: Payer: Self-pay | Admitting: Neurology

## 2021-02-18 ENCOUNTER — Telehealth: Payer: Self-pay

## 2021-02-18 ENCOUNTER — Other Ambulatory Visit: Payer: Self-pay

## 2021-02-18 MED ORDER — AIMOVIG 140 MG/ML ~~LOC~~ SOAJ: 140.0000 mg | SUBCUTANEOUS | 5 refills | Status: DC

## 2021-02-18 NOTE — Telephone Encounter (Signed)
New message   Your information has been sent to [employer].  Shannon Mccoy (Key: [id number]) Rx #: [id number] Aimovig 140MG /ML auto-injectors   Form [employer] ePA Form 2017 NCPDP Created 6 hours ago Sent to Plan 9 minutes ago Plan Response 9 minutes ago Submit Clinical Questions less than a minute ago Determination Wait for Determination Please wait for [employer] 2017 to return a determination.

## 2021-02-18 NOTE — Progress Notes (Signed)
Per DR.Jaffe,I would like to start her on Aimovig 140mg  every 28 days.  We discussed that this is an auto-injector (like an epi-pen) but only taken every 28 days and is well-tolerated and effective in preventing migraines. It needs to be refrigerated and taken out about 30 minutes prior to use.  Most people inject into the thigh but may also inject in the stomach or triceps (she can go over it with the pharmacist.  She has already had side effects to propranolol and amitriptyline and I want to avoid topiramate as she is an and the side effects of brain fog may affect her ability to work.     Aimovig sent.

## 2021-02-18 NOTE — Telephone Encounter (Signed)
Shannon Mccoy (Key: [id number]) Rx #: [id number] Aimovig 140MG /ML auto-injectors   Form [employer] ePA Form 2017 NCPDP Created 7 hours ago Sent to Plan 1 hour ago Plan Response 1 hour ago Submit Clinical Questions 1 hour ago Determination Favorable 1 hour ago Message from Plan The request has been approved. The authorization is effective for a maximum of 6 fills from 02/18/2021 to 08/18/2021, as long as the member is enrolled in their current health plan. The request was approved with a quantity restriction. This has been approved for a quantity limit of 1 with a day supply limit of 30. A written notification letter will follow with additional details.

## 2021-02-21 NOTE — Progress Notes (Unsigned)
Subjective:    Patient ID: Shannon Mccoy is a 25 y.o. female.  HPI {Common ambulatory SmartLinks:[id number]}  Review of Systems  Objective:  Neurological Exam  Physical Exam  Assessment:   ***  Plan:   ***

## 2021-02-21 NOTE — Telephone Encounter (Signed)
Shannon Mccoy (Key: [id number]) Rx #: [id number] Aimovig 140MG /ML auto-injectors   Form [employer] ePA Form 2017 NCPDP Created 3 days ago Sent to Plan 3 days ago Plan Response 3 days ago Submit Clinical Questions 3 days ago Determination Favorable 3 days ago Message from Plan The request has been approved. The authorization is effective for a maximum of 6 fills from 02/18/2021 to 08/18/2021, as long as the member is enrolled in their current health plan. The request was approved with a quantity restriction. This has been approved for a quantity limit of 1 with a day supply limit of 30. A written notification letter will follow with additional details.

## 2021-03-21 ENCOUNTER — Ambulatory Visit: Payer: 59 | Admitting: Neurology

## 2021-07-04 DIAGNOSIS — Z6828 Body mass index (BMI) 28.0-28.9, adult: Secondary | ICD-10-CM | POA: Diagnosis not present

## 2021-07-04 DIAGNOSIS — Z01411 Encounter for gynecological examination (general) (routine) with abnormal findings: Secondary | ICD-10-CM | POA: Diagnosis not present

## 2021-07-04 DIAGNOSIS — Z01419 Encounter for gynecological examination (general) (routine) without abnormal findings: Secondary | ICD-10-CM | POA: Diagnosis not present

## 2021-07-04 DIAGNOSIS — Z124 Encounter for screening for malignant neoplasm of cervix: Secondary | ICD-10-CM | POA: Diagnosis not present

## 2021-07-04 DIAGNOSIS — Z118 Encounter for screening for other infectious and parasitic diseases: Secondary | ICD-10-CM | POA: Diagnosis not present

## 2021-07-04 DIAGNOSIS — Z113 Encounter for screening for infections with a predominantly sexual mode of transmission: Secondary | ICD-10-CM | POA: Diagnosis not present

## 2021-07-08 ENCOUNTER — Emergency Department (HOSPITAL_COMMUNITY): Payer: 59

## 2021-07-08 ENCOUNTER — Encounter (HOSPITAL_COMMUNITY): Payer: Self-pay

## 2021-07-08 ENCOUNTER — Emergency Department (HOSPITAL_COMMUNITY)
Admission: EM | Admit: 2021-07-08 | Discharge: 2021-07-08 | Disposition: A | Discharge status: Home / Self Care | Payer: 59 | Attending: Emergency Medicine | Admitting: Emergency Medicine

## 2021-07-08 DIAGNOSIS — N83291 Other ovarian cyst, right side: Secondary | ICD-10-CM | POA: Diagnosis not present

## 2021-07-08 DIAGNOSIS — N83201 Unspecified ovarian cyst, right side: Secondary | ICD-10-CM | POA: Insufficient documentation

## 2021-07-08 DIAGNOSIS — R1031 Right lower quadrant pain: Secondary | ICD-10-CM | POA: Diagnosis present

## 2021-07-08 DIAGNOSIS — R109 Unspecified abdominal pain: Secondary | ICD-10-CM | POA: Diagnosis not present

## 2021-07-08 LAB — COMPREHENSIVE METABOLIC PANEL
ALT: 23 U/L (ref 0–44)
AST: 16 U/L (ref 15–41)
Albumin: 4.2 g/dL (ref 3.5–5.0)
Alkaline Phosphatase: 53 U/L (ref 38–126)
Anion gap: 7 (ref 5–15)
BUN: 10 mg/dL (ref 6–20)
CO2: 26 mmol/L (ref 22–32)
Calcium: 9.4 mg/dL (ref 8.9–10.3)
Chloride: 105 mmol/L (ref 98–111)
Creatinine, Ser: 0.64 mg/dL (ref 0.44–1.00)
GFR, Estimated: >60 mL/min (ref >60)
Glucose, Bld: 92 mg/dL (ref 70–99)
Potassium: 3.6 mmol/L (ref 3.5–5.1)
Sodium: 138 mmol/L (ref 135–145)
Total Bilirubin: 0.6 mg/dL (ref 0.3–1.2)
Total Protein: 7.4 g/dL (ref 6.5–8.1)

## 2021-07-08 LAB — I-STAT BETA HCG BLOOD, ED (MC, WL, AP ONLY): I-stat hCG, quantitative: <5 m[IU]/mL (ref <5)

## 2021-07-08 LAB — URINALYSIS, ROUTINE W REFLEX MICROSCOPIC
Bilirubin Urine: NEGATIVE
Glucose, UA: NEGATIVE mg/dL
Hgb urine dipstick: NEGATIVE
Ketones, ur: NEGATIVE mg/dL
Nitrite: NEGATIVE
Protein, ur: NEGATIVE mg/dL
Specific Gravity, Urine: 1.01 (ref 1.005–1.030)
pH: 8 (ref 5.0–8.0)

## 2021-07-08 LAB — CBC
HCT: 42.6 % (ref 36.0–46.0)
Hemoglobin: 13.8 g/dL (ref 12.0–15.0)
MCH: 28.6 pg (ref 26.0–34.0)
MCHC: 32.4 g/dL (ref 30.0–36.0)
MCV: 88.2 fL (ref 80.0–100.0)
Platelets: 403 10*3/uL — ABNORMAL HIGH (ref 150–400)
RBC: 4.83 MIL/uL (ref 3.87–5.11)
RDW: 12.4 % (ref 11.5–15.5)
WBC: 13.2 10*3/uL — ABNORMAL HIGH (ref 4.0–10.5)
nRBC: 0 % (ref 0.0–0.2)

## 2021-07-08 LAB — LIPASE, BLOOD: Lipase: 40 U/L (ref 11–51)

## 2021-07-08 MED ORDER — SODIUM CHLORIDE (PF) 0.9 % IJ SOLN
INTRAMUSCULAR | Status: AC
  Filled 2021-07-08: qty 50

## 2021-07-08 MED ORDER — ONDANSETRON 4 MG PO TBDP: 4.0000 mg | ORAL_TABLET | Freq: Three times a day (TID) | ORAL | 0 refills | Status: DC | PRN

## 2021-07-08 MED ORDER — NAPROXEN 500 MG PO TABS: 500.0000 mg | ORAL_TABLET | Freq: Two times a day (BID) | ORAL | 0 refills | Status: DC

## 2021-07-08 MED ORDER — SODIUM CHLORIDE 0.9 % IV BOLUS
1000.0000 mL | Freq: Once | INTRAVENOUS | Status: AC
  Administered 2021-07-08: 1000 mL via INTRAVENOUS

## 2021-07-08 MED ORDER — KETOROLAC TROMETHAMINE 15 MG/ML IJ SOLN
15.0000 mg | Freq: Once | INTRAMUSCULAR | Status: AC
Start: 2021-07-08 — End: 2021-07-08
  Administered 2021-07-08: 15 mg via INTRAVENOUS
  Filled 2021-07-08: qty 1

## 2021-07-08 MED ORDER — IOHEXOL 300 MG/ML  SOLN
100.0000 mL | Freq: Once | INTRAMUSCULAR | Status: AC | PRN
  Administered 2021-07-08: 100 mL via INTRAVENOUS

## 2021-07-08 NOTE — Discharge Instructions (Signed)
You were evaluated in the Emergency Department and after careful evaluation, we did not find any emergent condition requiring admission or further testing in the hospital. ? ?Your exam/testing today was overall reassuring.  Symptoms seem to be due to an ovarian cyst.  Recommend the Naprosyn anti-inflammatory for pain twice daily, can use the Zofran for nausea.  Recommend follow-up with your OB/GYN. ? ?Please return to the Emergency Department if you experience any worsening of your condition.  Thank you for allowing [place] to be a part of your care. ? ?

## 2021-07-08 NOTE — ED Notes (Signed)
I provided reinforced discharge education based off of discharge instructions. Pt acknowledged and understood my education. Pt had no further questions/concerns for provider/myself.  °

## 2021-07-08 NOTE — ED Triage Notes (Signed)
Pt complains of nausea and vomiting since Friday, she also states that she's been having severe indigestion ?

## 2021-07-08 NOTE — ED Triage Notes (Signed)
Pt complains of right lower abd pain ?

## 2021-07-08 NOTE — ED Provider Notes (Signed)
?[facility]ERGENCY DEPT ?[facility] Emergency [facility] ?Provider Note ?MRN:  [id number]  ?Arrival date & time: 07/08/21    ? ?Chief Complaint   ?Emesis ?  ?History of Present Illness   ?Shannon Mccoy is a 26 y.o. year-old female with a history of IBS presenting to the ED with chief complaint of emesis. ? ?Emesis and right lower quadrant abdominal pain for the past day or so.  Also having some diarrhea.  No vaginal bleeding or discharge, no fever. ? ?Review of Systems  ?A thorough review of systems was obtained and all systems are negative except as noted in the HPI and PMH.  ? ?Patient's Health History   ? ?Past Medical History:  ?Diagnosis Date  ? Allergy   ? IBS (irritable bowel syndrome)   ? Otitis media   ?  ?Past Surgical History:  ?Procedure Laterality Date  ? COLONOSCOPY  05/21/2020  ? TYMPANOTOMY    ? UPPER GASTROINTESTINAL ENDOSCOPY  05/21/2020  ?  ?Family History  ?Problem Relation Age of Onset  ? Migraines Mother   ? Celiac disease Brother   ? Breast cancer Maternal Aunt   ?     age unknown  ? Dementia Maternal Grandmother   ? Hyperlipidemia Maternal Grandfather   ? Kidney disease Maternal Grandfather   ? Liver cancer Maternal Great-grandfather   ? Prostate cancer Maternal Great-grandfather   ? Breast cancer Maternal Great-grandmother   ? Diabetes Maternal Great-grandmother   ? Breast cancer Other   ?     great aunt   ? Esophageal cancer Neg Hx   ? Rectal cancer Neg Hx   ? Colon cancer Neg Hx   ? Colon polyps Neg Hx   ? Stomach cancer Neg Hx   ?  ?Social History  ? ?Socioeconomic History  ? Marital status: Single  ?  Spouse name: Not on file  ? Number of children: 0  ? Years of education: Not on file  ? Highest education level: Not on file  ?Occupational History  ? Occupation: CNA  ?  Employer: [employer]  ?Tobacco Use  ? Smoking status: Never  ? Smokeless tobacco: Never  ?Vaping Use  ? Vaping Use: Never used  ?Substance and Sexual Activity  ? Alcohol use: No  ? Drug use: No  ?  Sexual activity: Not Currently  ?Other Topics Concern  ? Not on file  ?Social History Narrative  ? Not on file  ? ?Social Determinants of Health  ? ?Financial Resource Strain: Not on file  ?Food Insecurity: Not on file  ?Transportation Needs: Not on file  ?Physical Activity: Not on file  ?Stress: Not on file  ?Social Connections: Not on file  ?Intimate Partner Violence: Not on file  ?  ? ?Physical Exam  ? ?Vitals:  ? 07/08/21 0430 07/08/21 0515  ?BP: 115/81 131/77  ?Pulse: 70 67  ?Resp: 18 18  ?Temp:    ?SpO2: 99% 100%  ?  ?CONSTITUTIONAL: Well-appearing, NAD ?NEURO/PSYCH:  Alert and oriented x 3, no focal deficits ?EYES:  eyes equal and reactive ?ENT/NECK:  no LAD, no JVD ?CARDIO: Regular rate, well-perfused, normal S1 and S2 ?PULM:  CTAB no wheezing or rhonchi ?GI/GU:  non-distended, mild right lower quadrant tenderness to palpation ?MSK/SPINE:  No gross deformities, no edema ?SKIN:  no rash, atraumatic ? ? ?*Additional and/or pertinent findings included in MDM below ? ?Diagnostic and Interventional Summary  ? ? EKG Interpretation ? ?Date/Time:    ?Ventricular Rate:    ?PR  Interval:    ?QRS Duration:   ?QT Interval:    ?QTC Calculation:   ?R Axis:     ?Text Interpretation:   ?  ? ?  ? ?Labs Reviewed  ?CBC - Abnormal; Notable for the following components:  ?    Result Value  ? WBC 13.2 (*)   ? Platelets 403 (*)   ? All other components within normal limits  ?URINALYSIS, ROUTINE W REFLEX MICROSCOPIC - Abnormal; Notable for the following components:  ? Color, Urine STRAW (*)   ? APPearance HAZY (*)   ? Leukocytes,Ua TRACE (*)   ? Bacteria, UA RARE (*)   ? All other components within normal limits  ?COMPREHENSIVE METABOLIC PANEL  ?LIPASE, BLOOD  ?I-STAT BETA HCG BLOOD, ED ([facility], [facility], [facility] ONLY)  ?  ?CT ABDOMEN PELVIS W CONTRAST  ?Final Result  ?  ?  ?Medications  ?sodium chloride 0.9 % bolus 1,000 mL (1,000 mLs Intravenous New Bag/Given 07/08/21 0429)  ?ketorolac (TORADOL) 15 MG/ML injection 15 mg (15 mg Intravenous Given  07/08/21 0430)  ?iohexol (OMNIPAQUE) 300 MG/ML solution 100 mL (100 mLs Intravenous Contrast Given 07/08/21 0546)  ?sodium chloride (PF) 0.9 % injection (  Given by Other 07/08/21 0603)  ?  ? ?Procedures  /  Critical Care ?Procedures ? ?ED Course and Medical Decision Making  ?Initial Impression and Ddx ?Differential diagnosis includes appendicitis, right-sided diverticulitis, kidney stone, IBS, ovarian cyst.  Doubt torsion.  Awaiting CT, labs. ? ?Past medical/surgical history that increases complexity of ED encounter: History of IBS ? ?Interpretation of Diagnostics ?I personally reviewed the laboratory assessment and my interpretation is as follows: No significant blood count or electrolyte disturbance. ?   ?CT revealing normal appendix, right ovarian cyst. ? ?Patient Reassessment and Ultimate Disposition/Management ?Patient feeling well on reassessment, currently without pain.  Pain has been present for 3 to 4 days.  Overall doubt ovarian torsion and currently without indication for further testing or admission.  Appropriate for discharge. ? ?Patient management required discussion with the following services or consulting groups:  None ? ?Complexity of Problems Addressed ?Acute illness or injury that poses threat of life of bodily function ? ?Additional Data Reviewed and Analyzed ?Further history obtained from: ?Further history from spouse/family member ? ?Additional Factors Impacting ED Encounter Risk ?Prescriptions ? ?Elmer Sow. Pilar Plate, MD ?[facility] ?[facility] [email or web address] ? ?Final Clinical Impressions(s) / ED Diagnoses  ? ?  ICD-10-CM   ?1. Cyst of right ovary  N83.201   ?  ?  ?ED Discharge Orders   ? ?      Ordered  ?  naproxen (NAPROSYN) 500 MG tablet  2 times daily       ? 07/08/21 0640  ?  ondansetron (ZOFRAN-ODT) 4 MG disintegrating tablet  Every 8 hours PRN       ? 07/08/21 0640  ? ?  ?  ? ?  ?  ? ?Discharge Instructions Discussed with and Provided to Patient:   ? ? ? ?Discharge Instructions   ? ?  ?You were evaluated in the Emergency Department and after careful evaluation, we did not find any emergent condition requiring admission or further testing in the hospital. ? ?Your exam/testing today was overall reassuring.  Symptoms seem to be due to an ovarian cyst.  Recommend the Naprosyn anti-inflammatory for pain twice daily, can use the Zofran for nausea.  Recommend follow-up with your OB/GYN. ? ?Please return to the Emergency Department if you experience any worsening  of your condition.  Thank you for allowing [place] to be a part of your care. ? ? ? ? ? ?  ?Sabas Sous, MD ?07/08/21 [phone] ? ?

## 2021-07-09 LAB — HM PAP SMEAR

## 2021-07-10 DIAGNOSIS — R109 Unspecified abdominal pain: Secondary | ICD-10-CM | POA: Diagnosis not present

## 2021-07-10 DIAGNOSIS — N83209 Unspecified ovarian cyst, unspecified side: Secondary | ICD-10-CM | POA: Diagnosis not present

## 2021-07-18 ENCOUNTER — Encounter: Payer: Self-pay | Admitting: Gastroenterology

## 2021-07-18 ENCOUNTER — Telehealth: Payer: Self-pay | Admitting: Gastroenterology

## 2021-07-18 NOTE — Telephone Encounter (Signed)
Patient called requesting a call from nurse to discuss new symptoms. Patient has not been seen since May 2022. I called patient back to schedule an OV, no response. Left voicemail.

## 2021-08-07 DIAGNOSIS — R1031 Right lower quadrant pain: Secondary | ICD-10-CM | POA: Diagnosis not present

## 2021-08-11 ENCOUNTER — Telehealth (HOSPITAL_COMMUNITY): Payer: Self-pay | Admitting: Pharmacy Technician

## 2021-08-11 NOTE — Telephone Encounter (Signed)
Patient Advocate Encounter   Received notification that prior authorization for Aimovig 140MG /ML auto-injectors is required.   PA submitted on 08/06/2021 Key [id number] Status is pending       08/08/2021, CPhT Pharmacy Patient Advocate Specialist [facility] Patient Advocate Team Direct Number: [phone]  Fax: [phone]

## 2021-08-11 NOTE — Telephone Encounter (Signed)
Patient Advocate Encounter  Prior Authorization for Aimovig 140MG /ML auto-injectors has been approved.    PA# [id number] Effective dates: 08/08/2021 through 08/08/2022      08/10/2022, CPhT Pharmacy Patient Advocate Specialist [facility] Patient Advocate Team Direct Number: [phone]  Fax: [phone]

## 2021-08-20 ENCOUNTER — Ambulatory Visit: Payer: 59 | Admitting: Gastroenterology

## 2021-08-20 ENCOUNTER — Other Ambulatory Visit: Payer: 59

## 2021-08-20 ENCOUNTER — Encounter: Payer: Self-pay | Admitting: Gastroenterology

## 2021-08-20 VITALS — BP 100/64 | HR 70 | Ht 65.0 in | Wt 167.2 lb

## 2021-08-20 DIAGNOSIS — R142 Eructation: Secondary | ICD-10-CM | POA: Diagnosis not present

## 2021-08-20 DIAGNOSIS — R140 Abdominal distension (gaseous): Secondary | ICD-10-CM | POA: Diagnosis not present

## 2021-08-20 DIAGNOSIS — K5902 Outlet dysfunction constipation: Secondary | ICD-10-CM | POA: Diagnosis not present

## 2021-08-20 DIAGNOSIS — K581 Irritable bowel syndrome with constipation: Secondary | ICD-10-CM

## 2021-08-20 MED ORDER — DICYCLOMINE HCL 20 MG PO TABS: 20.0000 mg | ORAL_TABLET | Freq: Four times a day (QID) | ORAL | 2 refills | Status: DC | PRN

## 2021-08-20 NOTE — Progress Notes (Addendum)
Referring Provider: Jearld Fenton, NP Primary Care Physician:  Jearld Fenton, NP  Chief complaint:  Smelly burps   IMPRESSION:  Malodorous eructation x 2 weeks, now resolved without intervention    - prior testing for H pylori negative    - denies stress of dietary changes    - no response to TUMS Leukocytosis and thrombocytosis on recent CBC    - reportedly normal on follow-up with OB/GYN Chronic gastritis without H pylori    - avoiding NSAIDs Constipation due to type I pelvic dyssynergia on anorectal manometry performed at [facility] 2018    - 1 session of PT was not beneficial and cost prohibitive    - no biofeedback performed    - did not find recent PT helpful    - squatty potty is improving symptoms Constipation predominant IBS overlap    - likely IBS overlap    - TSH, IgA, TTGA 12/10/16    - anorectal manometry performed at [facility] 01/13/17    - Test for SIBO negative at [facility]    - failed trials of: laxatives, Amitiza, Miralax, magnesium citrate, Linzess, Trulance Elevated calcium, normal on repeat testing Multiple family members with liver cancer (maternal grandfather, maternal great grandmother, and maternal aunt)  PLAN: - Continue low FODMAP diet with goal of reintroducing one food every couple of weeks - Continue Bentyl 20 mg QID PRN (refilled today) - High fiber diet not recommended - as it is expected to exacerbate her symptoms - Avoid NSAIDs - H pylori stool antigen now - SIBO breath test if symptoms recur as she is at risk given her chronic constipation - See patient instructions for recommendations about diet and home remedy options - She will follow-up if symptoms recur to proceed with additional testing  HPI: Shannon Mccoy is a 26 y.o. female who presents today with concerns for burps that smell like gas. She was last seen 07/03/20 for chronic constipation with type 1 pelvic floor dyssynergia and IBS-C overlap. The interval history is obtained through the  patient and review of her electronic health record. She is working as a Quarry manager at [employer].  Endoscopic evaluation of abdominal pain, early satiety, and bloating in the setting of ongoing constipation with a sense of incomplete evacuation included:   EGD 05/21/20 showed mild chronic gastritis and was otherwise normal. Biopsies were negative for H pylori. Duodenal biopsies were normal.  Colonoscopy performed to distal ascending colon due to looping was normal.   At the time of her last follow-up 07/14/20 symptoms were better controlled on low FODMAP diet, avoiding dairy, use of a squatty potty and exercise than any medications that she had tried in the past. She was having one formed bowel movement daily using Miralax daily and increasing her daily water consumption.  Returns today with concerns for 2 weeks of malodorous eructation that resolved last month without intervention. Symptoms were occurring intermittently during the time she had a ruptured ovarian cyst. She continues to have intermittent bloating but denies abdominal pain or change in bowel habits. She thought it might be acid reflux but she noticed no improvement with Tums. No change in diet or increase in stress. No significant alcohol intake.   She continues to use Bentyl PRN for abdominal spasms and is requesting a refill.   Prior GI imaging: - CT abd/pelvis with contrast 10/22/15: no GI tract abnormalities except for a fairly large amount of stool within the colon - CT abd/pelvis with contrast 07/08/21 showed  a 4.8 cm adnexal simple-appearing cyst but was otherwise negative  Labs 04/15/18: calcium elevated at 10.4 (had been repeatedly normal at 9.4-9.7 prior to that time) Labs 04/15/18: normal CBC except for platelets of 498 Labs 08/02/18: normal TSH, ESR, CRP, Giardia, and fecal calprotectin Labs 03/06/20: normal CMP including calcium of 9.4, meat allergy testing negative, TSH 2.57 Labs 07/08/21: normal CMP, lipase, CBC, urinalysis except  for a WBC of 13.2 and platelets of 403  Endoscopic history: - EGD 05/21/20 showed mild chronic gastritis and was otherwise normal. Biopsies were negative for H pylori. Duodenal biopsies were normal.  - Colonoscopy performed to distal ascending colon due to looping was normal.   Past Medical History:  Diagnosis Date   Allergy    IBS (irritable bowel syndrome)    Otitis media     Past Surgical History:  Procedure Laterality Date   COLONOSCOPY  05/21/2020   TYMPANOTOMY     UPPER GASTROINTESTINAL ENDOSCOPY  05/21/2020    Current Outpatient Medications  Medication Sig Dispense Refill   dicyclomine (BENTYL) 20 MG tablet Take 1 tablet (20 mg total) by mouth 4 (four) times daily as needed for spasms. 30 tablet 2   Erenumab-aooe (AIMOVIG) 140 MG/ML SOAJ Inject 140 mg into the skin every 30 (thirty) days. 1.12 mL 5   polyethylene glycol (MIRALAX / GLYCOLAX) 17 g packet Take 17 g by mouth daily.     propranolol ER (INDERAL LA) 60 MG 24 hr capsule Take 1 capsule (60 mg total) by mouth daily. (Patient not taking: Reported on 08/20/2021) 30 capsule 5   No current facility-administered medications for this visit.    Allergies as of 08/20/2021 - Review Complete 08/20/2021  Allergen Reaction Noted   Fish allergy Nausea And Vomiting 04/04/2011   Food Nausea And Vomiting 04/04/2011   Lactose intolerance (gi)  09/18/2020   Sulfa antibiotics  10/22/2015   Elemental sulfur Rash 12/10/2016    Family History  Problem Relation Age of Onset   Migraines Mother    Celiac disease Brother    Breast cancer Maternal Aunt        age unknown   Dementia Maternal Grandmother    Hyperlipidemia Maternal Grandfather    Kidney disease Maternal Grandfather    Liver cancer Maternal Great-grandfather    Prostate cancer Maternal Great-grandfather    Breast cancer Maternal Great-grandmother    Diabetes Maternal Great-grandmother    Breast cancer Other        great aunt    Esophageal cancer Neg Hx    Rectal  cancer Neg Hx    Colon cancer Neg Hx    Colon polyps Neg Hx    Stomach cancer Neg Hx      Physical Exam: Vital signs were reviewed. General:   Alert, well-nourished, pleasant and cooperative in NAD.  No pallor. Head:  Normocephalic and atraumatic.  No alopecia. Eyes:  Sclera clear, no icterus.   Conjunctiva pink. Abdomen:  Soft, nontender, normal bowel sounds. No rebound or guarding. No hepatosplenomegaly Neurologic:  Alert and  oriented x4;  grossly nonfocal Skin:  No rash or bruise. Psych:  Alert and cooperative. Normal mood and affect.   Jamella Grayer L. Tarri Glenn, MD, MPH [facility] 08/20/2021, 8:46 AM

## 2021-08-20 NOTE — Patient Instructions (Addendum)
It was a pleasure to see you.  Stinky burps can be caused by stress, reflux, irritable bowel syndrome, and some stomach infections.  I recommend that you stop by the lab today for testing for H pylori.   Foods can cause bad smelling burps including high protein foods, milk, broccoli, brussel sprouts, cauliflower, garlic and beer.   I recommend that you keep a journal to try to isolate any foods that may be causing or aggravating smelly burps.  Journaling may also allow you to see if stress may be contributing. There is a strong connection between anxiety and the digestive tract.   Natural home remedies have provided relief for some people. While there is no guarantee that these treatments will work, these remedies are safe to try and will should not make things worse.  Tea -- Green tea, peppermint tea or chamomile tea can aid digestion and have been known to reduce sulfur burps.  Water -- Stay hydrated. Sufficient water protects the stomach from bacteria and can help the digestive system break down heavier proteins and sulfur-containing foods.  Manuka honey -- This unique honey can protect the digestive lining, eliminate harmful bacteria in the gut and relieve digestive distress.  Apple cider vinegar -- A spoonful of apple cider vinegar per day can help regulate the growth of bacteria in the digestive tract and keep digestion balanced.  If your stinky burps return, I recommend a trial of omeprazole 40 mg daily. This is available OTC or I can get you a prescription. We could also consider testing for bacterial overgrowth.

## 2021-08-22 ENCOUNTER — Other Ambulatory Visit: Payer: 59

## 2021-08-22 DIAGNOSIS — R140 Abdominal distension (gaseous): Secondary | ICD-10-CM | POA: Diagnosis not present

## 2021-08-22 DIAGNOSIS — K5902 Outlet dysfunction constipation: Secondary | ICD-10-CM

## 2021-08-22 DIAGNOSIS — R142 Eructation: Secondary | ICD-10-CM

## 2021-08-22 DIAGNOSIS — K581 Irritable bowel syndrome with constipation: Secondary | ICD-10-CM

## 2021-08-24 LAB — H. PYLORI ANTIGEN, STOOL: H pylori Ag, Stl: NEGATIVE

## 2021-09-08 NOTE — Progress Notes (Unsigned)
NEUROLOGY FOLLOW UP OFFICE NOTE  Diannia Hogenson [id number]  Assessment/Plan:   Migraine without aura, without status migrainosus, not intractable - improved with stress-reduction and modified diet.   Migraine prevention:  Consider adding magnesium citrate 400mg  daily, riboflavin 400mg  daily and CoQ-10 100mg  three times daily Migraine rescue:  advised to try sumatriptan 100mg  to try and abort the migraines quickly Limit use of pain relievers to no more than 2 days out of week to prevent risk of rebound or medication-overuse headache. Keep headache diary Follow up 4 months         Subjective:  DORATHEA FAERBER is a 26 year old female with IBS who follows up for migraine.  UPDATE: Started propranolol in December but blood pressure dropped significantly.  Instead, started on Aimovig.  But stopped in April-May because it was ineffective.  Sumatriptan was prescribed but she doesn't remember if she took it.  She had started eating better and with less stress.   Intensity:  6-7/10 Duration:  few hours or all time (depending on stress) Frequency:  1 to 2 times a week (depending on stress level) Frequency of abortive medication: none.  Not taking anything for the headaches. Current NSAIDS/analgesics:  none Current triptans:  none Current ergotamine:  none Current anti-emetic:  none Current muscle relaxants:  none Current Antihypertensive medications:  none Current Antidepressant medications:  none Current Anticonvulsant medications:  none Current anti-CGRP:  Aimovig 140mg  Current Vitamins/Herbal/Supplements:  none Current Antihistamines/Decongestants:  none Other therapy:  none Hormone/birth control:  none  Caffeine:  1 medium coffee daily Diet:  Improved diet.   Exercise:  gym at least 4-5 times a week Depression:  none; Anxiety:  none Other pain:  no Sleep hygiene:  Varies.  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  She denies nausea, vomiting, dizziness, visual disturbance, associated unilateral numbness or weakness. Duration:  1 to 2 days (rarely 3 days) Frequency:  always a daily headache.  Severe headaches occur on average 7 days a month Frequency of abortive medication: none Triggers:  stress (especially work-related stress in the ED), certain foods that trigger IBS Relieving factors:  resting in dark and quiet room Activity:  cannot function if severe.    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 120 tablet 2   Erenumab-aooe (AIMOVIG) 140 MG/ML SOAJ Inject 140 mg into the skin every 30 (thirty) days.  1.12 mL 5   polyethylene glycol (MIRALAX / GLYCOLAX) 17 g packet Take 17 g by mouth daily.     propranolol ER (INDERAL LA) 60 MG 24 hr capsule Take 1 capsule (60 mg total) by mouth daily. (Patient not taking: Reported on  08/20/2021) 30 capsule 5   No current facility-administered medications on file prior to visit.    ALLERGIES: Allergies  Allergen Reactions   Fish Allergy Nausea And Vomiting   Food Nausea And Vomiting    Coconut, fish, mushrooms   Lactose Intolerance (Gi)     bloating   Sulfa Antibiotics     Unknown reaction.    Elemental Sulfur Rash    FAMILY HISTORY: Family History  Problem Relation Age of Onset   Migraines Mother    Celiac disease Brother    Breast cancer Maternal Aunt        age unknown   Dementia Maternal Grandmother    Hyperlipidemia Maternal Grandfather    Kidney disease Maternal Grandfather    Liver cancer Maternal Great-grandfather    Prostate cancer Maternal Great-grandfather    Breast cancer Maternal Great-grandmother    Diabetes Maternal Great-grandmother    Breast cancer Other        great aunt    Esophageal cancer Neg Hx    Rectal cancer Neg Hx    Colon cancer Neg Hx    Colon polyps Neg Hx    Stomach cancer Neg Hx       Objective:  Blood pressure 103/63, pulse 74, resp. rate 18, height 5\' 5"  (1.651 m), weight 163 lb (73.9 kg), SpO2 98 %. General: No acute distress.  Patient appears well-groomed.      , DO  CC: Shon Millet, NP

## 2021-09-09 ENCOUNTER — Ambulatory Visit: Payer: 59 | Admitting: Neurology

## 2021-09-09 ENCOUNTER — Encounter: Payer: Self-pay | Admitting: Neurology

## 2021-09-09 VITALS — BP 103/63 | HR 74 | Resp 18 | Ht 65.0 in | Wt 163.0 lb

## 2021-09-09 DIAGNOSIS — G43009 Migraine without aura, not intractable, without status migrainosus: Secondary | ICD-10-CM | POA: Diagnosis not present

## 2021-09-09 MED ORDER — SUMATRIPTAN SUCCINATE 100 MG PO TABS: ORAL_TABLET | ORAL | 5 refills | Status: DC

## 2021-09-09 NOTE — Patient Instructions (Signed)
  Consider supplements:  magnesium citrate 400mg  daily, riboflavin 400mg  daily, coenzyme Q10 100mg  three times daily. Take sumatriptan 100mg  at earliest onset of headache.  May repeat dose once in 2 hours if needed.  Maximum 2 tablets in 24 hours. Limit use of pain relievers to no more than 2 days out of the week.  These medications include acetaminophen, NSAIDs (ibuprofen/Advil/Motrin, naproxen/Aleve, triptans (Imitrex/sumatriptan), Excedrin, and narcotics.  This will help reduce risk of rebound headaches. Be aware of common food triggers:  - Caffeine:  coffee, black tea, cola, Mt. Dew  - Chocolate  - Dairy:  aged cheeses (brie, blue, cheddar, gouda, [place], provolone, [place], Swiss, etc), chocolate milk, buttermilk, sour cream, limit eggs and yogurt  - Nuts, peanut butter  - Alcohol  - Cereals/grains:  FRESH breads (fresh bagels, sourdough, doughnuts), yeast productions  - Processed/canned/aged/cured meats (pre-packaged deli meats, hotdogs)  - MSG/glutamate:  soy sauce, flavor enhancer, pickled/preserved/marinated foods  - Sweeteners:  aspartame (Equal, Nutrasweet).  Sugar and Splenda are okay  - Vegetables:  legumes (lima beans, lentils, snow peas, fava beans, pinto peans, peas, garbanzo beans), sauerkraut, onions, olives, pickles  - Fruit:  avocados, bananas, citrus fruit (orange, lemon, grapefruit), mango  - Other:  Frozen meals, macaroni and cheese Routine exercise Stay adequately hydrated (aim for 64 oz water daily) Keep headache diary Maintain proper stress management Maintain proper sleep hygiene Do not skip meals

## 2021-10-06 DIAGNOSIS — M5387 Other specified dorsopathies, lumbosacral region: Secondary | ICD-10-CM | POA: Diagnosis not present

## 2021-10-06 DIAGNOSIS — M9903 Segmental and somatic dysfunction of lumbar region: Secondary | ICD-10-CM | POA: Diagnosis not present

## 2021-10-06 DIAGNOSIS — M9902 Segmental and somatic dysfunction of thoracic region: Secondary | ICD-10-CM | POA: Diagnosis not present

## 2021-10-06 DIAGNOSIS — M461 Sacroiliitis, not elsewhere classified: Secondary | ICD-10-CM | POA: Diagnosis not present

## 2021-10-06 DIAGNOSIS — M9901 Segmental and somatic dysfunction of cervical region: Secondary | ICD-10-CM | POA: Diagnosis not present

## 2021-10-06 DIAGNOSIS — M9905 Segmental and somatic dysfunction of pelvic region: Secondary | ICD-10-CM | POA: Diagnosis not present

## 2021-10-06 DIAGNOSIS — M7912 Myalgia of auxiliary muscles, head and neck: Secondary | ICD-10-CM | POA: Diagnosis not present

## 2021-10-23 DIAGNOSIS — M9903 Segmental and somatic dysfunction of lumbar region: Secondary | ICD-10-CM | POA: Diagnosis not present

## 2021-10-23 DIAGNOSIS — M9905 Segmental and somatic dysfunction of pelvic region: Secondary | ICD-10-CM | POA: Diagnosis not present

## 2021-10-23 DIAGNOSIS — M9901 Segmental and somatic dysfunction of cervical region: Secondary | ICD-10-CM | POA: Diagnosis not present

## 2021-10-23 DIAGNOSIS — M9902 Segmental and somatic dysfunction of thoracic region: Secondary | ICD-10-CM | POA: Diagnosis not present

## 2021-10-23 DIAGNOSIS — M5387 Other specified dorsopathies, lumbosacral region: Secondary | ICD-10-CM | POA: Diagnosis not present

## 2021-10-23 DIAGNOSIS — M7912 Myalgia of auxiliary muscles, head and neck: Secondary | ICD-10-CM | POA: Diagnosis not present

## 2021-10-23 DIAGNOSIS — M461 Sacroiliitis, not elsewhere classified: Secondary | ICD-10-CM | POA: Diagnosis not present

## 2022-01-14 NOTE — Progress Notes (Deleted)
NEUROLOGY FOLLOW UP OFFICE NOTE  Steph Cheadle [id number]  Assessment/Plan:   Migraine without aura, without status migrainosus, not intractable - improved with stress-reduction and modified diet.   Migraine prevention:  Consider adding magnesium citrate 400mg  daily, riboflavin 400mg  daily and CoQ-10 100mg  three times daily Migraine rescue:  advised to try sumatriptan 100mg  to try and abort the migraines quickly Limit use of pain relievers to no more than 2 days out of week to prevent risk of rebound or medication-overuse headache. Keep headache diary Follow up 4 months         Subjective:  TALAH COOKSTON is a 26 year old female with IBS who follows up for migraine.   UPDATE: *** Intensity:  6-7/10 Duration:  few hours or all time (depending on stress) Frequency:  1 to 2 times a week (depending on stress level) Frequency of abortive medication: none.  Not taking anything for the headaches. Current NSAIDS/analgesics:  none Current triptans:  sumatriptan 100mg  Current ergotamine:  none Current anti-emetic:  none Current muscle relaxants:  none Current Antihypertensive medications:  none Current Antidepressant medications:  none Current Anticonvulsant medications:  none Current anti-CGRP:  Aimovig 140mg  Current Vitamins/Herbal/Supplements:  none Current Antihistamines/Decongestants:  none Other therapy:  none Hormone/birth control:  none   Caffeine:  1 medium coffee daily Diet:  Improved diet.   Exercise:  gym at least 4-5 times a week Depression:  none; Anxiety:  none Other pain:  no Sleep hygiene:  Varies.  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  She denies nausea, vomiting, dizziness, visual  disturbance, associated unilateral numbness or weakness. Duration:  1 to 2 days (rarely 3 days) Frequency:  always a daily headache.  Severe headaches occur on average 7 days a month Frequency of abortive medication: none Triggers:  stress (especially work-related stress in the ED), certain foods that trigger IBS Relieving factors:  resting in dark and quiet room Activity:  cannot function if severe.     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 120 tablet 2   Erenumab-aooe (AIMOVIG) 140 MG/ML SOAJ Inject 140 mg into the skin every 30 (thirty) days. (Patient not taking: Reported on 09/09/2021) 1.12 mL 5   polyethylene glycol (MIRALAX / GLYCOLAX) 17 g packet Take 17 g by mouth daily.     propranolol ER (INDERAL LA) 60 MG 24 hr capsule Take 1  capsule (60 mg total) by mouth daily. (Patient not taking: Reported on 08/20/2021) 30 capsule 5   SUMAtriptan (IMITREX) 100 MG tablet Take 1 tablet earliest onset of migraine.  May repeat in 2 hours if headache persists or recurs.  Maximum 2 tablets in 24 hours. 10 tablet 5   No  current facility-administered medications on file prior to visit.    ALLERGIES: Allergies  Allergen Reactions   Fish Allergy Nausea And Vomiting   Food Nausea And Vomiting    Coconut, fish, mushrooms   Lactose Intolerance (Gi)     bloating   Sulfa Antibiotics     Unknown reaction.    Elemental Sulfur Rash    FAMILY HISTORY: Family History  Problem Relation Age of Onset   Migraines Mother    Celiac disease Brother    Breast cancer Maternal Aunt        age unknown   Dementia Maternal Grandmother    Hyperlipidemia Maternal Grandfather    Kidney disease Maternal Grandfather    Liver cancer Maternal Great-grandfather    Prostate cancer Maternal Great-grandfather    Breast cancer Maternal Great-grandmother    Diabetes Maternal Great-grandmother    Breast cancer Other        great aunt    Esophageal cancer Neg Hx    Rectal cancer Neg Hx    Colon cancer Neg Hx    Colon polyps Neg Hx    Stomach cancer Neg Hx       Objective:  *** General: No acute distress.  Patient appears well-groomed.   Head:  Normocephalic/atraumatic Eyes:  Fundi examined but not visualized Neck: supple, no paraspinal tenderness, full range of motion Heart:  Regular rate and rhythm Lungs:  Clear to auscultation bilaterally Back: No paraspinal tenderness Neurological Exam: alert and oriented to person, place, and time.  Speech fluent and not dysarthric, language intact.  CN II-XII intact. Bulk and tone normal, muscle strength 5/5 throughout.  Sensation to light touch intact.  Deep tendon reflexes 2+ throughout, toes downgoing.  Finger to nose testing intact.  Gait normal, Romberg negative.   Shon Millet, DO  CC: Nicki Reaper, NP

## 2022-01-21 ENCOUNTER — Encounter: Payer: Self-pay | Admitting: Neurology

## 2022-01-21 ENCOUNTER — Ambulatory Visit: Payer: 59 | Admitting: Neurology

## 2022-01-21 DIAGNOSIS — Z029 Encounter for administrative examinations, unspecified: Secondary | ICD-10-CM

## 2022-02-01 ENCOUNTER — Encounter: Payer: Self-pay | Admitting: Gastroenterology

## 2022-02-10 DIAGNOSIS — M9902 Segmental and somatic dysfunction of thoracic region: Secondary | ICD-10-CM | POA: Diagnosis not present

## 2022-02-10 DIAGNOSIS — M461 Sacroiliitis, not elsewhere classified: Secondary | ICD-10-CM | POA: Diagnosis not present

## 2022-02-10 DIAGNOSIS — M9903 Segmental and somatic dysfunction of lumbar region: Secondary | ICD-10-CM | POA: Diagnosis not present

## 2022-02-10 DIAGNOSIS — M9905 Segmental and somatic dysfunction of pelvic region: Secondary | ICD-10-CM | POA: Diagnosis not present

## 2022-02-10 DIAGNOSIS — M9901 Segmental and somatic dysfunction of cervical region: Secondary | ICD-10-CM | POA: Diagnosis not present

## 2022-02-10 DIAGNOSIS — M5387 Other specified dorsopathies, lumbosacral region: Secondary | ICD-10-CM | POA: Diagnosis not present

## 2022-02-10 DIAGNOSIS — M7912 Myalgia of auxiliary muscles, head and neck: Secondary | ICD-10-CM | POA: Diagnosis not present

## 2022-03-17 DIAGNOSIS — M7912 Myalgia of auxiliary muscles, head and neck: Secondary | ICD-10-CM | POA: Diagnosis not present

## 2022-03-17 DIAGNOSIS — M9901 Segmental and somatic dysfunction of cervical region: Secondary | ICD-10-CM | POA: Diagnosis not present

## 2022-03-17 DIAGNOSIS — M9902 Segmental and somatic dysfunction of thoracic region: Secondary | ICD-10-CM | POA: Diagnosis not present

## 2022-03-17 DIAGNOSIS — M461 Sacroiliitis, not elsewhere classified: Secondary | ICD-10-CM | POA: Diagnosis not present

## 2022-03-17 DIAGNOSIS — M9905 Segmental and somatic dysfunction of pelvic region: Secondary | ICD-10-CM | POA: Diagnosis not present

## 2022-03-17 DIAGNOSIS — M9903 Segmental and somatic dysfunction of lumbar region: Secondary | ICD-10-CM | POA: Diagnosis not present

## 2022-03-17 DIAGNOSIS — M5387 Other specified dorsopathies, lumbosacral region: Secondary | ICD-10-CM | POA: Diagnosis not present

## 2022-03-20 ENCOUNTER — Ambulatory Visit (INDEPENDENT_AMBULATORY_CARE_PROVIDER_SITE_OTHER): Payer: Commercial Managed Care - PPO | Admitting: Family Medicine

## 2022-03-20 ENCOUNTER — Encounter: Payer: Self-pay | Admitting: Family Medicine

## 2022-03-20 VITALS — BP 108/76 | HR 71 | Temp 97.6°F | Ht 65.0 in | Wt 176.0 lb

## 2022-03-20 DIAGNOSIS — R519 Headache, unspecified: Secondary | ICD-10-CM

## 2022-03-20 DIAGNOSIS — Z7689 Persons encountering health services in other specified circumstances: Secondary | ICD-10-CM

## 2022-03-20 DIAGNOSIS — J302 Other seasonal allergic rhinitis: Secondary | ICD-10-CM

## 2022-03-20 DIAGNOSIS — K581 Irritable bowel syndrome with constipation: Secondary | ICD-10-CM

## 2022-03-20 NOTE — Progress Notes (Signed)
New Patient Office Visit  Subjective    Patient ID: Shannon Mccoy, female    DOB: 04-14-1995  Age: 27 y.o. MRN: [id number]  CC:  Chief Complaint  Patient presents with   Establish Care    Needs to schedule physical    HPI Shannon Mccoy presents to establish care   OB/GYN- Dr. Ronita Hipps  GI- Dr. Tarri Glenn, IBS Dr. Tomi Likens- headaches.   Takes Claritin prn allergies, seasonal.   IBS-C - managing well.   Headaches- controlled.   Denies fever, chills, dizziness, chest pain, palpitations, shortness of breath, abdominal pain, N/V/D, urinary symptoms, LE edema.    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   Erenumab-aooe (AIMOVIG) 140 MG/ML SOAJ Inject 140 mg into the skin every 30 (thirty) days.   polyethylene glycol (MIRALAX / GLYCOLAX) 17 g packet Take 17 g by mouth daily.   SUMAtriptan (IMITREX) 100 MG tablet Take 1 tablet earliest onset of migraine.  May repeat in 2 hours if headache persists or recurs.  Maximum 2 tablets in 24 hours.   [DISCONTINUED] propranolol ER (INDERAL LA) 60 MG 24 hr capsule Take 1 capsule (60 mg total) by mouth daily. (Patient not taking: Reported on 08/20/2021)   No facility-administered encounter medications on file as of 03/20/2022.    Past Medical History:  Diagnosis Date   Allergy    IBS (irritable bowel syndrome)    Otitis media     Past Surgical History:  Procedure Laterality Date   COLONOSCOPY  05/21/2020   TYMPANOTOMY     UPPER GASTROINTESTINAL ENDOSCOPY  05/21/2020    Family History  Problem Relation Age of Onset   Migraines Mother    Celiac disease Brother    Breast cancer Maternal Aunt        age unknown   Dementia Maternal Grandmother    Hyperlipidemia Maternal Grandfather    Kidney disease Maternal  Grandfather    Liver cancer Maternal Great-grandfather    Prostate cancer Maternal Great-grandfather    Breast cancer Maternal Great-grandmother    Diabetes Maternal Great-grandmother    Breast cancer Other        great aunt    Esophageal cancer Neg Hx    Rectal cancer Neg Hx    Colon cancer Neg Hx    Colon polyps Neg Hx    Stomach cancer Neg Hx     Social History   Socioeconomic History   Marital status: Single    Spouse name: Not on file   Number of children: 0   Years of education: Not on file   Highest education level: Not on file  Occupational History   Occupation: CNA    Employer: [employer]  Tobacco Use   Smoking status: Never   Smokeless tobacco: Never  Vaping Use   Vaping Use: Never used  Substance and Sexual Activity   Alcohol use: No   Drug use: No   Sexual activity: Not Currently  Other Topics Concern   Not on file  Social History Narrative   Not on file   Social Determinants of Health   Financial Resource Strain: Not on file  Food Insecurity: Not on file  Transportation Needs: Not on file  Physical Activity: Sufficiently Active (  06/01/2018)   Exercise Vital Sign    Days of Exercise per Week: 3 days    Minutes of Exercise per Session: 60 min  Stress: No Stress Concern Present (06/01/2018)   [place]    Feeling of Stress : Only a little  Social Connections: Not on file  Intimate Partner Violence: Not on file    ROS      Objective    BP 108/76 (BP Location: Left Arm, Patient Position: Sitting, Cuff Size: Large)   Pulse 71   Temp 97.6 F (36.4 C) (Temporal)   Ht 5\' 5"  (1.651 m)   Wt 176 lb (79.8 kg)   SpO2 98%   BMI 29.29 kg/m   Physical Exam Constitutional:      General: She is not in acute distress.    Appearance: She is not ill-appearing.  Cardiovascular:     Rate and Rhythm: Normal rate.  Pulmonary:     Effort: Pulmonary effort is normal.   Neurological:     General: No focal deficit present.     Mental Status: She is alert and oriented to person, place, and time.  Psychiatric:        Mood and Affect: Mood normal.        Behavior: Behavior normal.        Thought Content: Thought content normal.         Assessment & Plan:   Problem List Items Addressed This Visit       Digestive   IBS (irritable bowel syndrome)     Other   Frequent headaches   Other Visit Diagnoses     Seasonal allergies    -  Primary   Encounter to establish care          She is a pleasant 27 year old female who is new to the practice and here to establish care.  She has seasonal allergies which are controlled with nondrowsy antihistamines as needed.  She is seeing neurology for headaches and GI for IBS.  Symptoms are all manageable.  She will follow-up with me at her convenience for a CPE.  Return for f/u for CPE at her convenience .   Harland Dingwall, NP-C

## 2022-03-23 ENCOUNTER — Encounter: Payer: Self-pay | Admitting: Family Medicine

## 2022-03-24 ENCOUNTER — Encounter: Payer: Commercial Managed Care - PPO | Admitting: Family Medicine

## 2022-03-26 ENCOUNTER — Encounter: Payer: Commercial Managed Care - PPO | Admitting: Family Medicine

## 2022-04-10 ENCOUNTER — Ambulatory Visit: Payer: Commercial Managed Care - PPO | Admitting: Physician Assistant

## 2022-04-16 DIAGNOSIS — M9902 Segmental and somatic dysfunction of thoracic region: Secondary | ICD-10-CM | POA: Diagnosis not present

## 2022-04-16 DIAGNOSIS — M7912 Myalgia of auxiliary muscles, head and neck: Secondary | ICD-10-CM | POA: Diagnosis not present

## 2022-04-16 DIAGNOSIS — M9903 Segmental and somatic dysfunction of lumbar region: Secondary | ICD-10-CM | POA: Diagnosis not present

## 2022-04-16 DIAGNOSIS — M461 Sacroiliitis, not elsewhere classified: Secondary | ICD-10-CM | POA: Diagnosis not present

## 2022-04-16 DIAGNOSIS — M9905 Segmental and somatic dysfunction of pelvic region: Secondary | ICD-10-CM | POA: Diagnosis not present

## 2022-04-16 DIAGNOSIS — M9901 Segmental and somatic dysfunction of cervical region: Secondary | ICD-10-CM | POA: Diagnosis not present

## 2022-04-16 DIAGNOSIS — M5387 Other specified dorsopathies, lumbosacral region: Secondary | ICD-10-CM | POA: Diagnosis not present

## 2022-04-21 ENCOUNTER — Encounter: Payer: Self-pay | Admitting: Gastroenterology

## 2022-04-23 NOTE — Telephone Encounter (Signed)
Schedule with any provider available let the pt know Dr Tarri Glenn is retiring.

## 2022-04-23 NOTE — Telephone Encounter (Signed)
Patient sent a MyChart requesting to make an appointment with Dr. Tarri Glenn, per your suggestion.  However, I do not have any open slots to give her.  Can you assist?

## 2022-05-07 ENCOUNTER — Ambulatory Visit: Payer: Commercial Managed Care - PPO | Admitting: Family Medicine

## 2022-05-14 DIAGNOSIS — M9902 Segmental and somatic dysfunction of thoracic region: Secondary | ICD-10-CM | POA: Diagnosis not present

## 2022-05-14 DIAGNOSIS — M7912 Myalgia of auxiliary muscles, head and neck: Secondary | ICD-10-CM | POA: Diagnosis not present

## 2022-05-14 DIAGNOSIS — M9901 Segmental and somatic dysfunction of cervical region: Secondary | ICD-10-CM | POA: Diagnosis not present

## 2022-05-14 DIAGNOSIS — M5387 Other specified dorsopathies, lumbosacral region: Secondary | ICD-10-CM | POA: Diagnosis not present

## 2022-05-14 DIAGNOSIS — M461 Sacroiliitis, not elsewhere classified: Secondary | ICD-10-CM | POA: Diagnosis not present

## 2022-05-14 DIAGNOSIS — M9903 Segmental and somatic dysfunction of lumbar region: Secondary | ICD-10-CM | POA: Diagnosis not present

## 2022-05-14 DIAGNOSIS — M9905 Segmental and somatic dysfunction of pelvic region: Secondary | ICD-10-CM | POA: Diagnosis not present

## 2022-06-10 ENCOUNTER — Encounter: Payer: Self-pay | Admitting: Gastroenterology

## 2022-06-10 ENCOUNTER — Telehealth: Payer: Self-pay | Admitting: *Deleted

## 2022-06-10 ENCOUNTER — Ambulatory Visit: Payer: Commercial Managed Care - PPO | Admitting: Gastroenterology

## 2022-06-10 VITALS — BP 122/68 | HR 76 | Ht 65.0 in | Wt 178.0 lb

## 2022-06-10 DIAGNOSIS — R142 Eructation: Secondary | ICD-10-CM

## 2022-06-10 DIAGNOSIS — R140 Abdominal distension (gaseous): Secondary | ICD-10-CM

## 2022-06-10 DIAGNOSIS — K5902 Outlet dysfunction constipation: Secondary | ICD-10-CM | POA: Diagnosis not present

## 2022-06-10 MED ORDER — RIFAXIMIN 550 MG PO TABS: 550.0000 mg | ORAL_TABLET | Freq: Three times a day (TID) | ORAL | 0 refills | Status: DC

## 2022-06-10 NOTE — Telephone Encounter (Signed)
Spoke with patient. See telephone contact.

## 2022-06-10 NOTE — Patient Instructions (Addendum)
We have sent the following medications to your pharmacy for you to pick up at your convenience: Xifaxan 550 mg three times daily.   _______________________________________________________  If your blood pressure at your visit was 140/90 or greater, please contact your primary care physician to follow up on this.  _______________________________________________________  If you are age 26 or older, your body mass index should be between 23-30. Your Body mass index is 29.62 kg/m. If this is out of the aforementioned range listed, please consider follow up with your Primary Care Provider.  If you are age 70 or younger, your body mass index should be between 19-25. Your Body mass index is 29.62 kg/m. If this is out of the aformentioned range listed, please consider follow up with your Primary Care Provider.   ________________________________________________________  The [facility] providers would like to encourage you to use [facility] to communicate with providers for non-urgent requests or questions.  Due to long hold times on the telephone, sending your provider a message by [facility] may be a faster and more efficient way to get a response.  Please allow 48 business hours for a response.  Please remember that this is for non-urgent requests.  _______________________________________________________

## 2022-06-10 NOTE — Telephone Encounter (Signed)
PRIOR AUTHORIZATION  PA initiation date: 06/10/22  Medication: Xifaxan 550 mg Insurance Company: [employer] completed electronically through [employer] My Meds: Yes Key: [id number]  Will await insurance response re: approval/denial.

## 2022-06-10 NOTE — Progress Notes (Signed)
Referring Provider: Avanell Shackleton, NP-C Primary Care Physician:  Avanell Shackleton, NP-C  Chief complaint:  Smelly burps   IMPRESSION:  Malodorous eructation x 2 weeks, now resolved without intervention    - prior testing for H pylori negative    - denies stress of dietary changes    - no response to TUMS Leukocytosis and thrombocytosis on recent CBC    - reportedly normal on follow-up with OB/GYN Chronic gastritis without H pylori    - avoiding NSAIDs Constipation due to type I pelvic dyssynergia on anorectal manometry performed at [facility] 2018    - 1 session of PT was not beneficial and cost prohibitive    - no biofeedback performed    - did not find recent PT helpful    - squatty potty improved symptoms Constipation predominant IBS overlap    - likely IBS overlap    - TSH, IgA, TTGA 12/10/16    - anorectal manometry performed at [facility] 01/13/17    - Test for SIBO negative at [facility]    - failed trials of: laxatives, Amitiza, Miralax, magnesium citrate, Linzess, Trulance Elevated calcium, normal on repeat testing Multiple family members with liver cancer (maternal grandfather, maternal great grandmother, and maternal aunt)  PLAN: - Resume Bentyl 20 mg QID PRN (refilled today) - Xifaxan 550 mg TID x 14 days, may substitute with doxycycline 100 mg BID - SIBO breath test if there is a response to antibiotics that is not prolonged given her high risk for concurrent SIBO in the setting of chronic constipation and IBS - See patient instructions for recommendations about diet and home remedy options - She will follow-up if symptoms recur to proceed with additional testing - Patient requests follow-up with Dr. Tomasa Rand in  8-10 weeks  HPI: Shannon Mccoy is a 27 y.o. female who presents today with concerns for burps that smell like gas. She was last seen 08/20/21 for chronic constipation with type 1 pelvic floor dyssynergia and IBS-C overlap and malodorous eructation. The interval  history is obtained through the patient and review of her electronic health record. 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 She is in school studying early elementary education with goal of working towards full time teaching.   Endoscopic evaluation of abdominal pain, early satiety, and bloating in the setting of ongoing constipation with a sense of incomplete evacuation has included:  - Labs 08/02/18: normal TSH, ESR, CRP, Giardia, and fecal calprotectin - Labs 03/06/20: food allergen profile negative - EGD 05/21/20 showed mild chronic gastritis and was otherwise normal. Biopsies were negative for H pylori. Duodenal biopsies were normal.  - Colonoscopy performed to distal ascending colon due to looping was normal.  - CT abd/pelvis with contrast 07/08/21 showed a 4.8 cm adnexal simple-appearing cyst but was otherwise negative - Labs 08/22/21: H pylori stool antigen negative  On office visit 07/14/21 her symptoms were better controlled with low FODMAP diet, avoiding dairy, use of a squatty potty and exercise than any medications that she had tried in the past. She was having one formed bowel movement daily using Miralax daily and increasing her daily water consumption.  Office visit 08/20/21 she reported concerns for 2 weeks of malodorous eructation that resolved last month without intervention. Symptoms were occurring intermittently during the time she had a ruptured ovarian cyst. She continues to have intermittent bloating but denies abdominal pain or change in bowel habits. She thought it might be acid  reflux but she noticed no improvement with Tums. No change in diet or increase in stress. No significant alcohol intake.   Returns today with ongoing gas, eructation and bloating. Stools fluctuate from light brown to dark brown. She is working out 2-4 days a week. Eating smaller portions with lots of fruits and vegetables. She is avoiding sugars and carbonated  beverages.    Endoscopic history: - EGD 05/21/20 showed mild chronic gastritis and was otherwise normal. Biopsies were negative for H pylori. Duodenal biopsies were normal.  - Colonoscopy performed to distal ascending colon due to looping was normal.   Past Medical History:  Diagnosis Date   Allergy    IBS (irritable bowel syndrome)    Otitis media     Past Surgical History:  Procedure Laterality Date   COLONOSCOPY  05/21/2020   TYMPANOTOMY     UPPER GASTROINTESTINAL ENDOSCOPY  05/21/2020    Current Outpatient Medications  Medication Sig Dispense Refill   dicyclomine (BENTYL) 20 MG tablet Take 1 tablet (20 mg total) by mouth 4 (four) times daily as needed for spasms. 120 tablet 2   Erenumab-aooe (AIMOVIG) 140 MG/ML SOAJ Inject 140 mg into the skin every 30 (thirty) days. 1.12 mL 5   polyethylene glycol (MIRALAX / GLYCOLAX) 17 g packet Take 17 g by mouth daily.     Prenatal-DSS-FeCb-FeGl-FA (CITRANATAL BLOOM PO) Take by mouth.     rifaximin (XIFAXAN) 550 MG TABS tablet Take 1 tablet (550 mg total) by mouth 3 (three) times daily. 42 tablet 0   SUMAtriptan (IMITREX) 100 MG tablet Take 1 tablet earliest onset of migraine.  May repeat in 2 hours if headache persists or recurs.  Maximum 2 tablets in 24 hours. 10 tablet 5   No current facility-administered medications for this visit.    Allergies as of 06/10/2022 - Review Complete 06/10/2022  Allergen Reaction Noted   Fish allergy Nausea And Vomiting 04/04/2011   Food Nausea And Vomiting 04/04/2011   Lactose intolerance (gi)  09/18/2020   Sulfa antibiotics  10/22/2015   Elemental sulfur Rash 12/10/2016    Family History  Problem Relation Age of Onset   Migraines Mother    Celiac disease Brother    Breast cancer Maternal Aunt        age unknown   Dementia Maternal Grandmother    Hyperlipidemia Maternal Grandfather    Kidney disease Maternal Grandfather    Liver cancer Maternal Great-grandfather    Prostate cancer Maternal  Great-grandfather    Breast cancer Maternal Great-grandmother    Diabetes Maternal Great-grandmother    Breast cancer Other        great aunt    Esophageal cancer Neg Hx    Rectal cancer Neg Hx    Colon cancer Neg Hx    Colon polyps Neg Hx    Stomach cancer Neg Hx      Physical Exam: Vital signs were reviewed. General:   Alert, well-nourished, pleasant and cooperative in NAD.  No pallor. Head:  Normocephalic and atraumatic.  No alopecia. Eyes:  Sclera clear, no icterus.   Conjunctiva pink. Abdomen:  Soft, nontender, normal bowel sounds. No rebound or guarding. No hepatosplenomegaly Neurologic:  Alert and  oriented x4;  grossly nonfocal Skin:  No rash or bruise. Psych:  Alert and cooperative. Normal mood and affect.   Jamison Yuhasz L. Orvan Falconer, MD, MPH [facility] 06/10/2022, 1:45 PM

## 2022-06-10 NOTE — Telephone Encounter (Signed)
Spoke with [facility] copay is $0. Patient informed.

## 2022-06-10 NOTE — Telephone Encounter (Signed)
APPROVAL  Medication: Xifaxan 550 mg Insurance Company: [employer] PA response: Approved Approval dates: 06/10/22 to 08/05/22 Misc. Notes: Ref. [id number]

## 2022-06-11 NOTE — Telephone Encounter (Signed)
Please advise 

## 2022-06-12 ENCOUNTER — Encounter: Payer: Commercial Managed Care - PPO | Admitting: Family Medicine

## 2022-06-16 ENCOUNTER — Encounter: Payer: Self-pay | Admitting: Gastroenterology

## 2022-06-17 ENCOUNTER — Encounter: Payer: Commercial Managed Care - PPO | Admitting: Family Medicine

## 2022-06-18 ENCOUNTER — Encounter: Payer: Commercial Managed Care - PPO | Admitting: Family Medicine

## 2022-06-24 ENCOUNTER — Ambulatory Visit: Payer: Commercial Managed Care - PPO | Admitting: Family Medicine

## 2022-07-02 ENCOUNTER — Ambulatory Visit: Payer: Commercial Managed Care - PPO | Admitting: Family Medicine

## 2022-07-02 ENCOUNTER — Encounter: Payer: Self-pay | Admitting: Family Medicine

## 2022-07-09 ENCOUNTER — Ambulatory Visit: Payer: Commercial Managed Care - PPO | Admitting: Family Medicine

## 2022-07-12 IMAGING — CT CT ABD-PELV W/ CM
2 of 4 series · 15 of 46 positions shown, 17 images · IV contrast (OMNIPAQUE 300)
Comparison: CT Abdomen and Pelvis 10/22/2015.

CLINICAL DATA: 25-year-old female with right lower quadrant
abdominal pain.

EXAM:
CT ABDOMEN AND PELVIS WITH CONTRAST
TECHNIQUE: Multidetector CT imaging of the abdomen and pelvis was performed
using the standard protocol following bolus administration of
intravenous contrast.

[Series 2: axial st · axial · 0.68mm/px · z∈[+1032,+1447]mm · 12 of 93 slices shown, 14 images]
[im 5/93  soft-tissue]
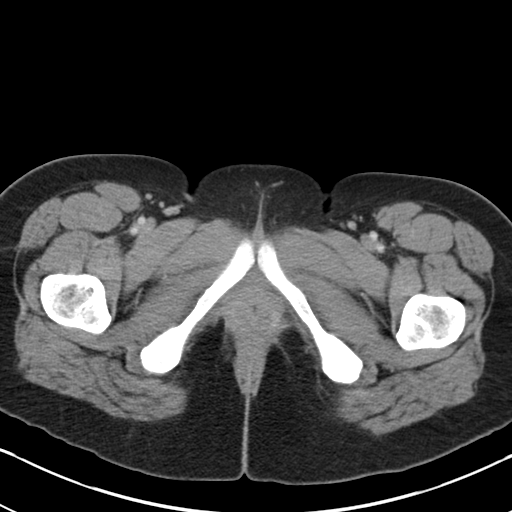
[im 5/93  bone]
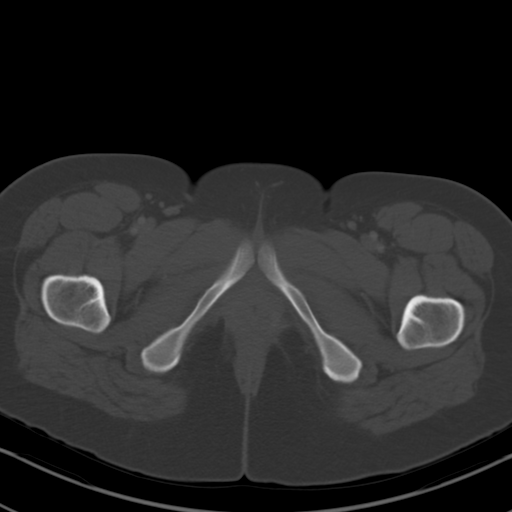
[im 14/93  soft-tissue]
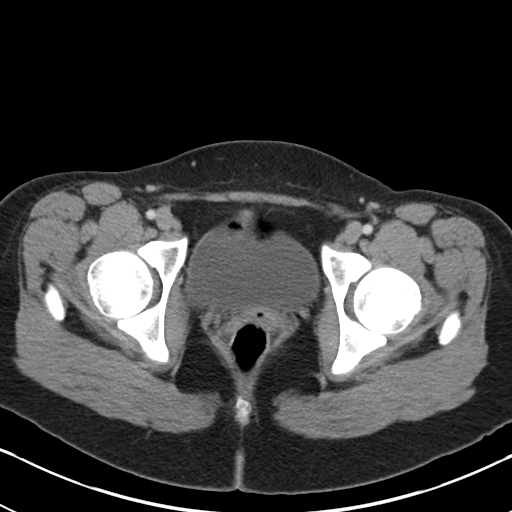
[im 19/93  soft-tissue]
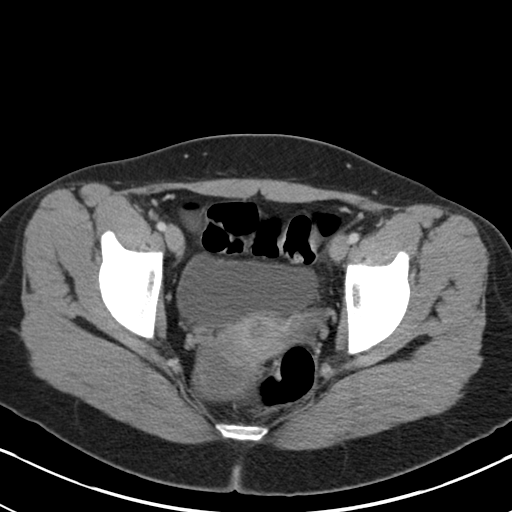
[im 28/93  soft-tissue]
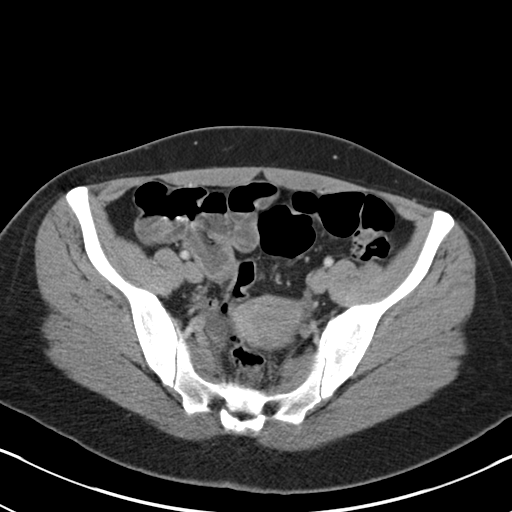
[im 37/93  soft-tissue]
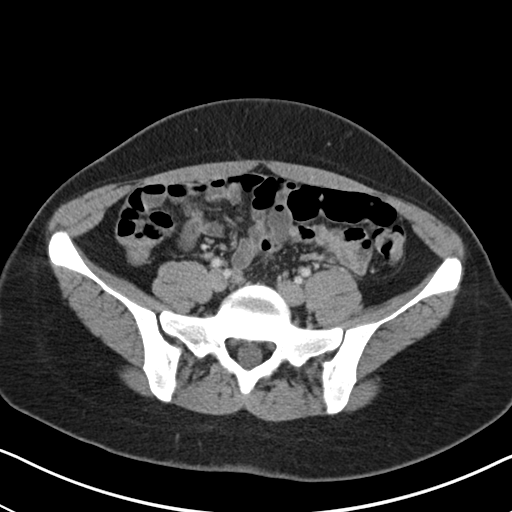
[im 42/93  soft-tissue]
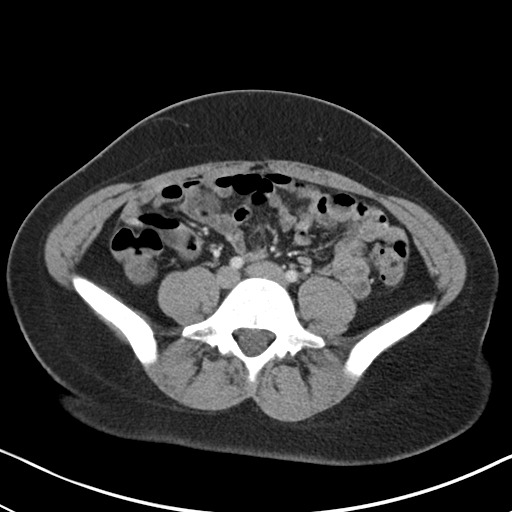
[im 51/93  soft-tissue]
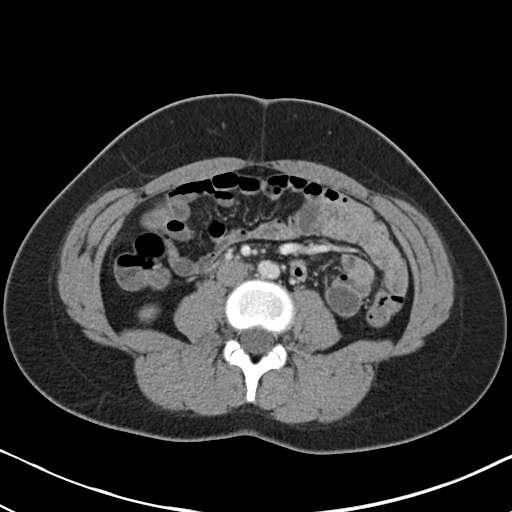
[im 56/93  soft-tissue]
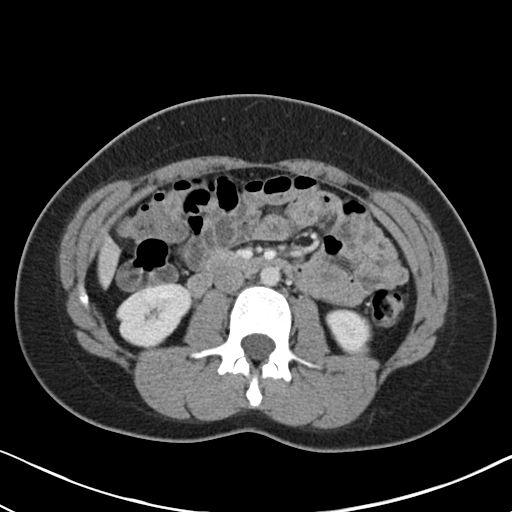
[im 65/93  soft-tissue]
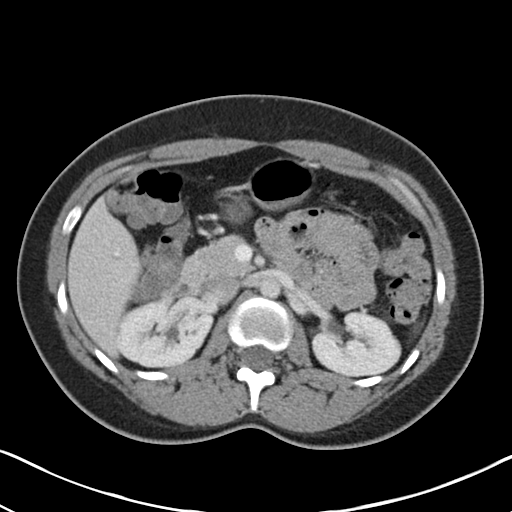
[im 65/93  bone]
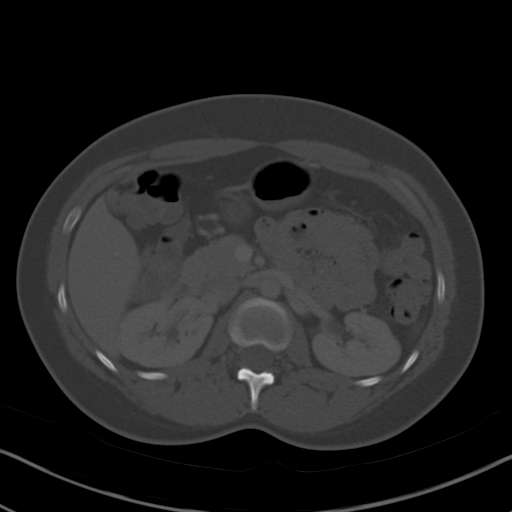
[im 74/93  soft-tissue]
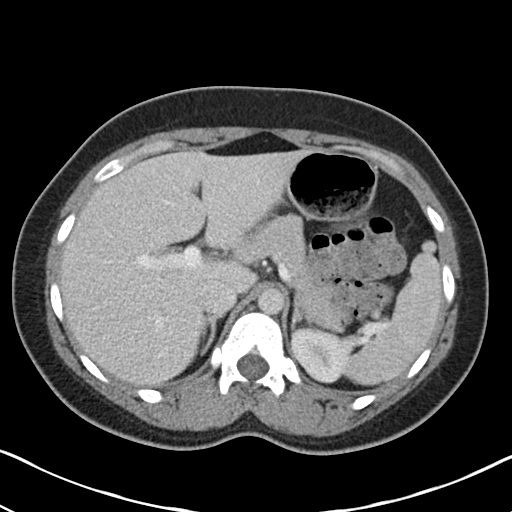
[im 79/93  soft-tissue]
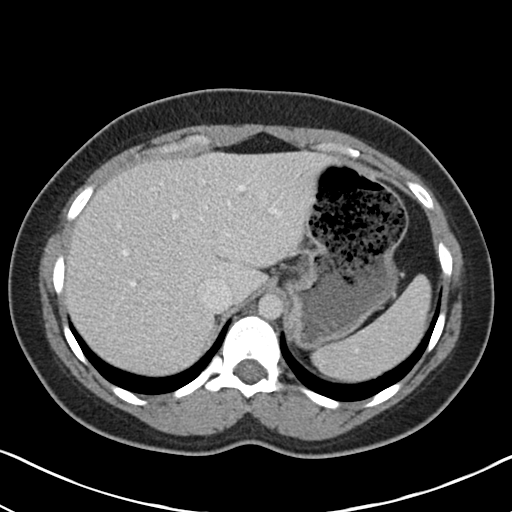
[im 88/93  soft-tissue]
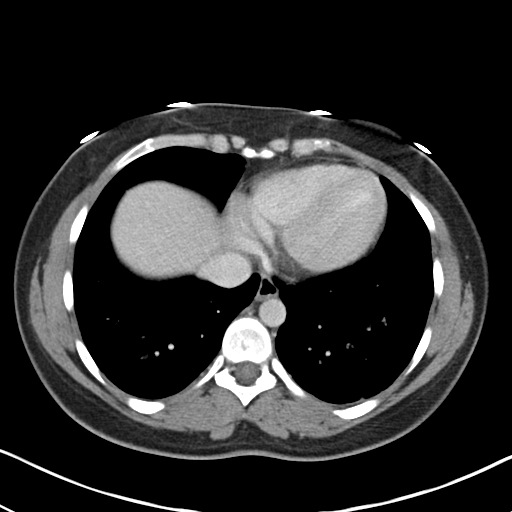

[Series 4: coronal st · coronal · 0.66mm/px · 3 of 129 slices shown]
[im 43/129  soft-tissue]
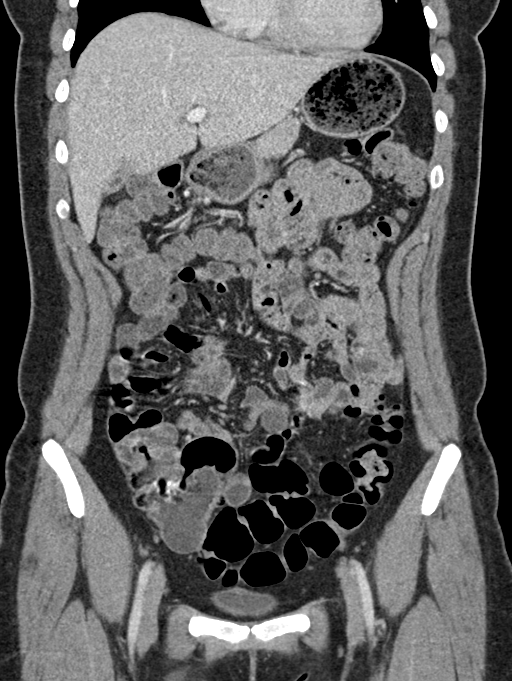
[im 57/129  soft-tissue]
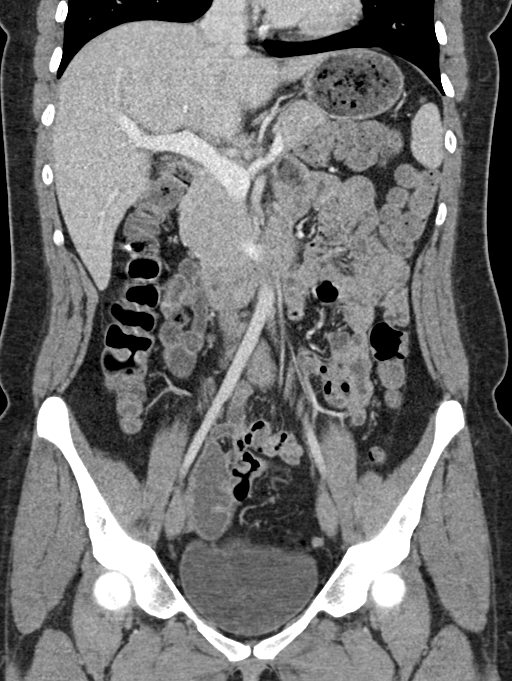
[im 72/129  soft-tissue]
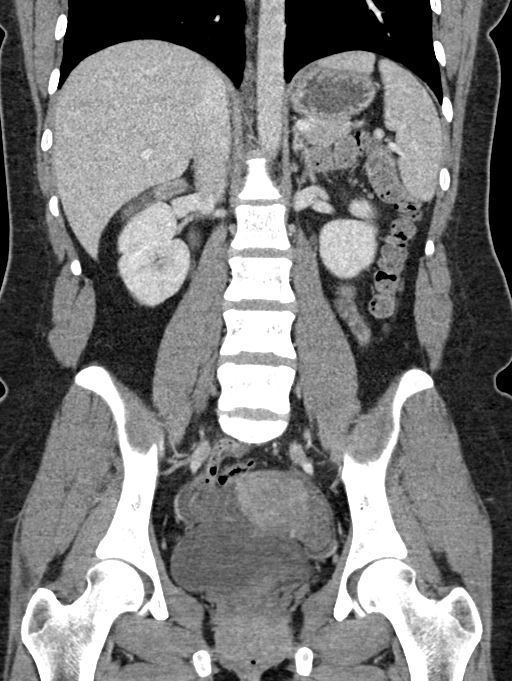

[15 of 46 positions shown; findings below may reference images not displayed]

RADIATION DOSE REDUCTION: This exam was performed according to the
departmental dose-optimization program which includes automated
exposure control, adjustment of the mA and/or kV according to
patient size and/or use of iterative reconstruction technique.

CONTRAST:  100mL OMNIPAQUE IOHEXOL 300 MG/ML  SOLN
FINDINGS: Lower chest: Mild bilateral lower lobe subpleural scarring or
atelectasis appears unchanged since 9716. Otherwise negative.

Hepatobiliary: Contracted gallbladder. Negative liver. No bile duct
enlargement.

Pancreas: Negative.

Spleen: Negative.

Adrenals/Urinary Tract: Negative.

Stomach/Bowel: Negative rectum. Redundant but negative sigmoid
colon. Mild retained stool in the transverse and descending colon.
Redundant flexures. Mild fluid in the right colon. Normal gas
containing appendix on series 2, image 65 and coronal image 71
tracks toward the right ovary. There is trace free fluid about the
right ovary and tip of the cecum, but no convincing bowel
inflammation.

Terminal ileum is within normal limits. No dilated small bowel.
Stomach and duodenum appear negative. No free air.

Vascular/Lymphatic: Major arterial structures and portal venous
system appear patent and normal. No lymphadenopathy identified.

Reproductive: 4.8 cm cystic right adnexal mass with simple fluid
density located in the cul-de-sac (series 2, image 73). The margins
are mildly crenulated and enhancing. Small volume of low-density
free fluid about the right ovary. Otherwise negative.

Other: No layering pelvic free fluid.

Musculoskeletal: No acute osseous abnormality identified.
IMPRESSION: 1. 4.8 cm right adnexal simple-appearing cyst (No follow-up imaging
is recommended. Reference: JACR [DATE]):[id number]).
Associated small volume of free fluid about both the right ovary and
at the tip of the cecum. But the appendix remains normal. No
convincing bowel inflammation.

2. Otherwise negative CT Abdomen and Pelvis.

## 2022-07-29 DIAGNOSIS — M9902 Segmental and somatic dysfunction of thoracic region: Secondary | ICD-10-CM | POA: Diagnosis not present

## 2022-07-29 DIAGNOSIS — M5387 Other specified dorsopathies, lumbosacral region: Secondary | ICD-10-CM | POA: Diagnosis not present

## 2022-07-29 DIAGNOSIS — M9903 Segmental and somatic dysfunction of lumbar region: Secondary | ICD-10-CM | POA: Diagnosis not present

## 2022-07-29 DIAGNOSIS — M9901 Segmental and somatic dysfunction of cervical region: Secondary | ICD-10-CM | POA: Diagnosis not present

## 2022-07-29 DIAGNOSIS — M9905 Segmental and somatic dysfunction of pelvic region: Secondary | ICD-10-CM | POA: Diagnosis not present

## 2022-07-29 DIAGNOSIS — M7912 Myalgia of auxiliary muscles, head and neck: Secondary | ICD-10-CM | POA: Diagnosis not present

## 2022-07-29 DIAGNOSIS — M461 Sacroiliitis, not elsewhere classified: Secondary | ICD-10-CM | POA: Diagnosis not present

## 2022-08-21 ENCOUNTER — Telehealth: Payer: Self-pay | Admitting: Gastroenterology

## 2022-08-21 ENCOUNTER — Ambulatory Visit: Payer: Commercial Managed Care - PPO | Admitting: Gastroenterology

## 2022-08-21 NOTE — Telephone Encounter (Signed)
Good Morning Dr. Tomasa Rand,   Patient called stating that she would not be able to make her 10:50 appointment with you today due to being up all night vomiting.   Patient rescheduled for 10/2 at 10:50

## 2022-08-24 ENCOUNTER — Encounter: Payer: Self-pay | Admitting: Gastroenterology

## 2022-09-22 DIAGNOSIS — M9903 Segmental and somatic dysfunction of lumbar region: Secondary | ICD-10-CM | POA: Diagnosis not present

## 2022-09-22 DIAGNOSIS — M461 Sacroiliitis, not elsewhere classified: Secondary | ICD-10-CM | POA: Diagnosis not present

## 2022-09-22 DIAGNOSIS — M9902 Segmental and somatic dysfunction of thoracic region: Secondary | ICD-10-CM | POA: Diagnosis not present

## 2022-09-22 DIAGNOSIS — M5387 Other specified dorsopathies, lumbosacral region: Secondary | ICD-10-CM | POA: Diagnosis not present

## 2022-09-22 DIAGNOSIS — M7912 Myalgia of auxiliary muscles, head and neck: Secondary | ICD-10-CM | POA: Diagnosis not present

## 2022-09-22 DIAGNOSIS — M9905 Segmental and somatic dysfunction of pelvic region: Secondary | ICD-10-CM | POA: Diagnosis not present

## 2022-09-22 DIAGNOSIS — M9901 Segmental and somatic dysfunction of cervical region: Secondary | ICD-10-CM | POA: Diagnosis not present

## 2022-10-02 ENCOUNTER — Ambulatory Visit: Payer: Commercial Managed Care - PPO | Admitting: Nurse Practitioner

## 2022-10-08 ENCOUNTER — Ambulatory Visit: Payer: Commercial Managed Care - PPO | Admitting: Gastroenterology

## 2022-10-08 ENCOUNTER — Other Ambulatory Visit: Payer: Commercial Managed Care - PPO

## 2022-10-08 ENCOUNTER — Encounter: Payer: Self-pay | Admitting: Gastroenterology

## 2022-10-08 VITALS — BP 118/62 | HR 64 | Ht 65.0 in | Wt 194.0 lb

## 2022-10-08 DIAGNOSIS — K581 Irritable bowel syndrome with constipation: Secondary | ICD-10-CM

## 2022-10-08 MED ORDER — IBSRELA 50 MG PO TABS
50.0000 mg | ORAL_TABLET | Freq: Two times a day (BID) | ORAL | 3 refills | Status: DC
Start: 2022-10-08 — End: 2022-12-08

## 2022-10-08 NOTE — Patient Instructions (Addendum)
We have sent the following medications to your pharmacy for you to pick up at your convenience:  [facility]  Your provider has requested that you go to the basement level for lab work before leaving today. Press "B" on the elevator. The lab is located at the first door on the left as you exit the elevator.  If your blood pressure at your visit was 140/90 or greater, please contact your primary care physician to follow up on this.  _______________________________________________________  If you are age 26 or older, your body mass index should be between 23-30. Your Body mass index is 32.28 kg/m. If this is out of the aforementioned range listed, please consider follow up with your Primary Care Provider.  If you are age 74 or younger, your body mass index should be between 19-25. Your Body mass index is 32.28 kg/m. If this is out of the aformentioned range listed, please consider follow up with your Primary Care Provider.   ________________________________________________________  The [facility] providers would like to encourage you to use [facility] to communicate with providers for non-urgent requests or questions.  Due to long hold times on the telephone, sending your provider a message by [facility] may be a faster and more efficient way to get a response.  Please allow 48 business hours for a response.  Please remember that this is for non-urgent requests.  _______________________________________________________

## 2022-10-08 NOTE — Progress Notes (Signed)
HPI : Shannon Mccoy is a 27 y.o. female with a history of IBS who presents to clinic for follow-up.  The patient was previously followed by Dr. Orvan Falconer. She was last seen by Dr. Orvan Falconer April 17, with chief complaint of foul-smelling burps.  She was prescribed Xifaxan 550 mg 3 times daily for 14 days and recommended to resume Bentyl 4 times daily as needed. Today, she reports ongoing problems with excessive bloating and abdominal distention.  She says that the rifaximin reduced her symptoms while she was taking it, but the symptoms returned quickly. She continues to have infrequent bowel movements, usually about once a week.  She is taking MiraLax daily.  Her stools are typically hard and difficult to pass.  She does not recall all the previous medications she has taken previously for her constipation, but Dr. Orvan Falconer' previous note indicates she had failed trials of Amitiza, Miralax, magnesium citrate, Linzess, Trulance.  She said that fiber supplements worsened her constipation.  She states that she had previously had improvement in her symptoms with a low FODMAP diet, but she lost so much weight, she was advised to stop.  She previously tried a gluten free diet without any improvement. She currently tries to limit processed foods and avoids garlic and onions.  She only gets her meat from the butcher, not the grocery store.    Endoscopic evaluation of abdominal pain, early satiety, and bloating in the setting of ongoing constipation with a sense of incomplete evacuation has included:  - Labs 08/02/18: normal TSH, ESR, CRP, Giardia, and fecal calprotectin - Labs 03/06/20: food allergen profile negative - EGD 05/21/20 showed mild chronic gastritis and was otherwise normal. Biopsies were negative for H pylori. Duodenal biopsies were normal.  - Colonoscopy performed to distal ascending colon due to looping was normal.  - CT abd/pelvis with contrast 07/08/21 showed a 4.8 cm adnexal  simple-appearing cyst but was otherwise negative - Labs 08/22/21: H pylori stool antigen negative   On office visit 07/14/21 her symptoms were better controlled with low FODMAP diet, avoiding dairy, use of a squatty potty and exercise than any medications that she had tried in the past. She was having one formed bowel movement daily using Miralax daily and increasing her daily water consumption.   Office visit 08/20/21 she reported concerns for 2 weeks of malodorous eructation that resolved last month without intervention. Symptoms were occurring intermittently during the time she had a ruptured ovarian cyst. She continues to have intermittent bloating but denies abdominal pain or change in bowel habits. She thought it might be acid reflux but she noticed no improvement with Tums. No change in diet or increase in stress. No significant alcohol intake.   Endoscopic history: - EGD 05/21/20 showed mild chronic gastritis and was otherwise normal. Biopsies were negative for H pylori. Duodenal biopsies were normal.  - Colonoscopy performed to distal ascending colon due to looping was normal.   Past Medical History:  Diagnosis Date   Allergy    IBS (irritable bowel syndrome)    Otitis media      Past Surgical History:  Procedure Laterality Date   COLONOSCOPY  05/21/2020   TYMPANOTOMY     UPPER GASTROINTESTINAL ENDOSCOPY  05/21/2020   Family History  Problem Relation Age of Onset   Migraines Mother    Celiac disease Brother    Breast cancer Maternal Aunt        age unknown   Dementia Maternal Grandmother    Hyperlipidemia Maternal  Grandfather    Kidney disease Maternal Grandfather    Liver cancer Maternal Great-grandfather    Prostate cancer Maternal Great-grandfather    Breast cancer Maternal Great-grandmother    Diabetes Maternal Great-grandmother    Breast cancer Other        great aunt    Esophageal cancer Neg Hx    Rectal cancer Neg Hx    Colon cancer Neg Hx    Colon polyps Neg Hx     Stomach cancer Neg Hx    Social History   Tobacco Use   Smoking status: Never   Smokeless tobacco: Never  Vaping Use   Vaping status: Never Used  Substance Use Topics   Alcohol use: No   Drug use: No   Current Outpatient Medications  Medication Sig Dispense Refill   dicyclomine (BENTYL) 20 MG tablet Take 1 tablet (20 mg total) by mouth 4 (four) times daily as needed for spasms. 120 tablet 2   Erenumab-aooe (AIMOVIG) 140 MG/ML SOAJ Inject 140 mg into the skin every 30 (thirty) days. 1.12 mL 5   polyethylene glycol (MIRALAX / GLYCOLAX) 17 g packet Take 17 g by mouth daily.     Prenatal-DSS-FeCb-FeGl-FA (CITRANATAL BLOOM PO) Take by mouth.     rifaximin (XIFAXAN) 550 MG TABS tablet Take 1 tablet (550 mg total) by mouth 3 (three) times daily. 42 tablet 0   SUMAtriptan (IMITREX) 100 MG tablet Take 1 tablet earliest onset of migraine.  May repeat in 2 hours if headache persists or recurs.  Maximum 2 tablets in 24 hours. 10 tablet 5   No current facility-administered medications for this visit.   Allergies  Allergen Reactions   Fish Allergy Nausea And Vomiting   Food Nausea And Vomiting    Coconut, fish, mushrooms   Lactose Intolerance (Gi)     bloating   Sulfa Antibiotics     Unknown reaction.    Elemental Sulfur Rash     Review of Systems: All systems reviewed and negative except where noted in HPI.    No results found.  Physical Exam: BP 118/62   Pulse 64   Ht 5\' 5"  (1.651 m)   Wt 194 lb (88 kg)   BMI 32.28 kg/m  Constitutional: Pleasant,well-developed, Caucasian female in no acute distress. HEENT: Normocephalic and atraumatic. Conjunctivae are normal. No scleral icterus. Neck supple.  Cardiovascular: Normal rate, regular rhythm.  Pulmonary/chest: Effort normal and breath sounds normal. No wheezing, rales or rhonchi. Abdominal: Soft, nondistended, nontender. Bowel sounds active throughout. There are no masses palpable. No hepatomegaly. Extremities: no  edema Lymphadenopathy: No cervical adenopathy noted. Neurological: Alert and oriented to person place and time. Skin: Skin is warm and dry. No rashes noted. Psychiatric: Normal mood and affect. Behavior is normal.  CBC    Component Value Date/Time   WBC 13.2 (H) 07/08/2021 0416   RBC 4.83 07/08/2021 0416   HGB 13.8 07/08/2021 0416   HCT 42.6 07/08/2021 0416   PLT 403 (H) 07/08/2021 0416   MCV 88.2 07/08/2021 0416   MCH 28.6 07/08/2021 0416   MCHC 32.4 07/08/2021 0416   RDW 12.4 07/08/2021 0416   LYMPHSABS 1.7 05/20/2010 1353   MONOABS 0.9 05/20/2010 1353   EOSABS 0.1 05/20/2010 1353   BASOSABS 0.0 05/20/2010 1353    CMP     Component Value Date/Time   NA 138 07/08/2021 0416   K 3.6 07/08/2021 0416   CL 105 07/08/2021 0416   CO2 26 07/08/2021 0416   GLUCOSE 92 07/08/2021  0416   BUN 10 07/08/2021 0416   CREATININE 0.64 07/08/2021 0416   CREATININE 0.71 04/15/2018 1523   CALCIUM 9.4 07/08/2021 0416   PROT 7.4 07/08/2021 0416   ALBUMIN 4.2 07/08/2021 0416   AST 16 07/08/2021 0416   ALT 23 07/08/2021 0416   ALKPHOS 53 07/08/2021 0416   BILITOT 0.6 07/08/2021 0416   GFRNONAA >60 07/08/2021 0416   GFRAA >60 10/22/2015 1528       Latest Ref Rng & Units 07/08/2021    4:16 AM 03/06/2020    9:49 AM 04/15/2018    3:23 PM  CBC EXTENDED  WBC 4.0 - 10.5 K/uL 13.2  11.5  11.7   RBC 3.87 - 5.11 MIL/uL 4.83  4.79  5.14   Hemoglobin 12.0 - 15.0 g/dL 54.2  70.6  23.7   HCT 36.0 - 46.0 % 42.6  40.4  44.0   Platelets 150 - 400 K/uL 403  400.0  498       ASSESSMENT AND PLAN:  27 year old female with long standing constipation-predominant IBS symptoms, previously failed numerous laxative/IBS medications in the past.  She reports symptom improvement with low FODMAP in the past, but states that she was advised to stop the diet due to excessive weight loss.  I advised her reconsider revisiting the low FODMAP diet, and to tailor the diet further to avoid further weight loss.  I think  she would be a good candidate for IBSrela given her failure of multiple previous therapies. Will screen for alpha-gal syndrome given she has never been tested for this before.  IBS- C - IBSrela - Alpha gal panel - Reconsider another trial of low FODMAP diet - Follow up 3 months  Alaura Schippers E. Tomasa Rand, MD [facility]  I spent a total of 35 minutes reviewing the patient's medical record, interviewing and examining the patient, discussing her diagnosis and management of her condition going forward, and documenting in the medical record   Henson, Vickie L, NP-C

## 2022-10-09 ENCOUNTER — Telehealth: Payer: Self-pay | Admitting: Gastroenterology

## 2022-10-09 NOTE — Telephone Encounter (Signed)
Inbound call from [employer] regarding prior authorization for Ibsrela for patient. Key number is [id number]. Call back number at [phone]. Please advise, thank you.

## 2022-10-12 ENCOUNTER — Telehealth: Payer: Self-pay

## 2022-10-12 NOTE — Telephone Encounter (Signed)
Pharmacy Patient Advocate Encounter   Received notification from RX Request Messages that prior authorization for Ibsrela 50MG  tablets is required/requested.   Insurance verification completed.   The patient is insured through [facility] .   Per test claim: PA required; PA submitted to [facility] via CoverMyMeds Key/confirmation #/EOC [id number] Status is pending

## 2022-10-15 ENCOUNTER — Ambulatory Visit: Payer: Commercial Managed Care - PPO | Admitting: Nurse Practitioner

## 2022-10-16 ENCOUNTER — Other Ambulatory Visit (HOSPITAL_COMMUNITY): Payer: Self-pay

## 2022-10-16 LAB — ALPHA-GAL PANEL
Allergen, Mutton, f88: <0.1 kU/L
Allergen, Pork, f26: <0.1 kU/L
Beef: <0.1 kU/L
CLASS: 0
CLASS: 0
Class: 0
GALACTOSE-ALPHA-1,3-GALACTOSE IGE*: <0.1 kU/L (ref <0.10)

## 2022-10-16 LAB — INTERPRETATION:

## 2022-10-16 NOTE — Telephone Encounter (Signed)
Pharmacy Patient Advocate Encounter  Received notification from [facility] that Prior Authorization for Ibsrela 50MG  tablets has been APPROVED from 10-14-2022 to 10-13-2023. Ran test claim, Copay is $0.00 Quantity approved 180 tablets per 90 days. This test claim was processed through [facility]- copay amounts may vary at other pharmacies due to pharmacy/plan contracts, or as the patient moves through the different stages of their insurance plan.   PA #/Case ID/Reference #: [id number]

## 2022-10-16 NOTE — Telephone Encounter (Signed)
Informed patient that Allena Napoleon has been approved. Patient verbalized understanding.

## 2022-10-19 NOTE — Progress Notes (Signed)
Shannon Mccoy,  Your alpha gal panel was negative (normal).  Please take the IBSrela as discussed and follow up in 2-3 months.

## 2022-10-20 NOTE — Telephone Encounter (Signed)
Received fax from [employer] stating that Hall Busing was approved with 0.00 dollar copay. Letter sent to scan.

## 2022-11-03 NOTE — Progress Notes (Unsigned)
Virtual Visit via Video Note  Consent was obtained for video visit:  Yes.   Answered questions that patient had about telehealth interaction:  Yes.   I discussed the limitations, risks, security and privacy concerns of performing an evaluation and management service by telemedicine. I also discussed with the patient that there may be a patient responsible charge related to this service. The patient expressed understanding and agreed to proceed.  Pt location: Home Physician Location: office Name of referring provider:  Avanell Shackleton, NP-C I connected with Shannon Mccoy at patients initiation/request on 11/04/2022 at 10:30 AM EDT by video enabled telemedicine application and verified that I am speaking with the correct person using two identifiers. Pt MRN:  [id number] Pt DOB:  Dec 23, 1995 Video Participants:  Shannon Mccoy   Assessment/Plan:   Migraine without aura, without status migrainosus, not intractable - improved with stress-reduction and modified diet.   Migraine prevention:  Not indicated Migraine rescue:  umatriptan 100mg  to try and abort the migraines quickly Limit use of pain relievers to no more than 2 days out of week to prevent risk of rebound or medication-overuse headache. Keep headache diary Follow up 1 year         Subjective:  Shannon Mccoy is a 27 year old female with IBS who follows up for migraine.  UPDATE: Last seen in July 2023.  Stress improved.  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  Sumatriptan helped but it's been so long, she doesn't remember how long the headache lasts.  She really hasn't had any significant headache until about a week ago. It lasted a day.  She didn't have anything to take.    Frequency of abortive medication: none.  Not taking anything for the headaches. Current NSAIDS/analgesics:  none Current triptans:  sumatriptan 100mg  Current ergotamine:  none Current anti-emetic:   none Current muscle relaxants:  none Current Antihypertensive medications:  none Current Antidepressant medications:  none Current Anticonvulsant medications:  none Current anti-CGRP:  none Current Vitamins/Herbal/Supplements:  none Current Antihistamines/Decongestants:  none Other therapy:  none Hormone/birth control:  none  Caffeine:  1 medium coffee daily Diet:  Improved diet.   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  She denies nausea, vomiting, dizziness, visual disturbance, associated unilateral numbness or weakness. Duration:  1 to 2 days (rarely 3 days) Frequency:  always a daily headache.  Severe headaches occur on average 7 days a month Frequency of abortive medication: none Triggers:  stress (especially work-related stress in the ED), certain foods that trigger IBS Relieving factors:  resting in dark and quiet room Activity:  cannot function if severe.    Past NSAIDS/analgesics:  Tylenol, ibuprofen, Fioricet Past abortive triptans:  none Past abortive ergotamine:  none Past muscle relaxants:  none Past anti-emetic:  none Past antihypertensive medications:  propranolol (hypotension) Past antidepressant medications:  amitriptyline 10mg  at bedtime (cannot take because she has unusual bedtime working at hospital 3 PM to 3 AM) Past anticonvulsant medications:  none Past anti-CGRP:  Aimovig Past vitamins/Herbal/Supplements:  none Past antihistamines/decongestants:  none Other past therapies:  none    Family history of headache:  mother (migraines)   Past Medical History: Past Medical History:  Diagnosis  Date   Allergy    IBS (irritable bowel syndrome)    Otitis media     Medications: Current Outpatient Medications on File Prior to Visit  Medication Sig Dispense Refill   dicyclomine (BENTYL) 20 MG tablet Take 1 tablet (20 mg total) by mouth 4 (four) times daily as needed for spasms. 120 tablet 2   polyethylene glycol (MIRALAX / GLYCOLAX) 17 g packet Take 17 g by mouth daily.     Prenatal-DSS-FeCb-FeGl-FA (CITRANATAL BLOOM PO) Take by mouth.     rifaximin (XIFAXAN) 550 MG TABS tablet Take 1 tablet (550 mg total) by mouth 3 (three) times daily. 42 tablet 0   Tenapanor HCl (IBSRELA) 50 MG TABS Take 50 mg by mouth 2 (two) times daily. 180 tablet 3   No current facility-administered medications on file prior to visit.     Allergies: Allergies  Allergen Reactions   Fish Allergy Nausea And Vomiting   Food Nausea And Vomiting    Coconut, fish, mushrooms   Lactose Intolerance (Gi)     bloating   Sulfa Antibiotics     Unknown reaction.    Elemental Sulfur Rash    Family History: Family History  Problem Relation Age of Onset   Migraines Mother    Celiac disease Brother    Breast cancer Maternal Aunt        age unknown   Dementia Maternal Grandmother    Hyperlipidemia Maternal Grandfather    Kidney disease Maternal Grandfather    Liver cancer Maternal Great-grandfather    Prostate cancer Maternal Great-grandfather    Breast cancer Maternal Great-grandmother    Diabetes Maternal Great-grandmother    Breast cancer Other        great aunt    Esophageal cancer Neg Hx    Rectal cancer Neg Hx    Colon cancer Neg Hx    Colon polyps Neg Hx    Stomach cancer Neg Hx     Observations/Objective:   No acute distress.  Alert and oriented.  Speech fluent and not dysarthric.  Language intact.     Follow Up Instructions:    -I discussed the assessment and treatment plan with the patient. The patient was provided an opportunity to ask questions and all were answered. The patient agreed  with the plan and demonstrated an understanding of the instructions.   The patient was advised to call back or seek an in-person evaluation if the symptoms worsen or if the condition fails to improve as anticipated.   Cira Servant, DO  CC: Nicki Reaper, NP

## 2022-11-04 ENCOUNTER — Telehealth (INDEPENDENT_AMBULATORY_CARE_PROVIDER_SITE_OTHER): Payer: Commercial Managed Care - PPO | Admitting: Neurology

## 2022-11-04 ENCOUNTER — Encounter: Payer: Self-pay | Admitting: Neurology

## 2022-11-04 DIAGNOSIS — G43009 Migraine without aura, not intractable, without status migrainosus: Secondary | ICD-10-CM | POA: Diagnosis not present

## 2022-11-04 MED ORDER — SUMATRIPTAN SUCCINATE 100 MG PO TABS: ORAL_TABLET | ORAL | 11 refills | Status: DC

## 2022-11-25 ENCOUNTER — Ambulatory Visit: Payer: Commercial Managed Care - PPO | Admitting: Gastroenterology

## 2022-11-27 ENCOUNTER — Encounter: Payer: Self-pay | Admitting: Gastroenterology

## 2022-12-08 ENCOUNTER — Telehealth: Payer: Self-pay

## 2022-12-08 MED ORDER — IBSRELA 50 MG PO TABS: 50.0000 mg | ORAL_TABLET | Freq: Two times a day (BID) | ORAL | 3 refills | Status: DC

## 2022-12-08 NOTE — Telephone Encounter (Signed)
Per MyChart message, patient is having trouble filling IBSRELA.  Called [facility].  The PA was approved in August. [facility] indicates they sent it to their partner, Merrily Brittle for fulfillment and patient should have been able to get at $0 copay.  [employer] indicates that Patient has to use [facility] or [facility] according to her [place].  [facility] indicated [facility]which [facility] [place] should be Ok according to her plan.  [facility] [place] and called patient and left her a message as such.  Let patient know we can get her some samples in the meantime if we have any trouble getting this resolved quickly for her.  Apologized for the delay in addressing this problem as we assumed she was getting her medication once PA had been approved.  For prescription, use BIN: [id number], PCN: [id number], Grp: [id number], ID: [id number]

## 2022-12-30 ENCOUNTER — Ambulatory Visit: Payer: Self-pay | Admitting: Gastroenterology

## 2023-01-06 DIAGNOSIS — M9902 Segmental and somatic dysfunction of thoracic region: Secondary | ICD-10-CM | POA: Diagnosis not present

## 2023-01-06 DIAGNOSIS — M461 Sacroiliitis, not elsewhere classified: Secondary | ICD-10-CM | POA: Diagnosis not present

## 2023-01-06 DIAGNOSIS — M9901 Segmental and somatic dysfunction of cervical region: Secondary | ICD-10-CM | POA: Diagnosis not present

## 2023-01-06 DIAGNOSIS — M5387 Other specified dorsopathies, lumbosacral region: Secondary | ICD-10-CM | POA: Diagnosis not present

## 2023-01-06 DIAGNOSIS — M9905 Segmental and somatic dysfunction of pelvic region: Secondary | ICD-10-CM | POA: Diagnosis not present

## 2023-01-06 DIAGNOSIS — M7912 Myalgia of auxiliary muscles, head and neck: Secondary | ICD-10-CM | POA: Diagnosis not present

## 2023-01-06 DIAGNOSIS — M9903 Segmental and somatic dysfunction of lumbar region: Secondary | ICD-10-CM | POA: Diagnosis not present

## 2023-01-21 ENCOUNTER — Telehealth: Payer: Commercial Managed Care - PPO | Admitting: Family Medicine

## 2023-01-21 DIAGNOSIS — J069 Acute upper respiratory infection, unspecified: Secondary | ICD-10-CM

## 2023-01-21 MED ORDER — PSEUDOEPH-BROMPHEN-DM 30-2-10 MG/5ML PO SYRP: 5.0000 mL | ORAL_SOLUTION | Freq: Four times a day (QID) | ORAL | 0 refills | Status: DC | PRN

## 2023-01-21 MED ORDER — FLUTICASONE PROPIONATE 50 MCG/ACT NA SUSP: 2.0000 | Freq: Every day | NASAL | 0 refills | Status: DC

## 2023-01-21 NOTE — Patient Instructions (Signed)
Shannon Mccoy, thank you for joining Freddy Finner, NP for today's virtual visit.  While this provider is not your primary care provider (PCP), if your PCP is located in our provider database this encounter information will be shared with them immediately following your visit.   A [facility] MyChart account gives you access to today's visit and all your visits, tests, and labs performed at [facility] " click here if you don't have a [facility] MyChart account or go to [email or web address]  Consent: (Patient) Shannon Mccoy provided verbal consent for this virtual visit at the beginning of the encounter.  Current Medications:  Current Outpatient Medications:    brompheniramine-pseudoephedrine-DM 30-2-10 MG/5ML syrup, Take 5 mLs by mouth 4 (four) times daily as needed., Disp: 120 mL, Rfl: 0   fluticasone (FLONASE) 50 MCG/ACT nasal spray, Place 2 sprays into both nostrils daily., Disp: 16 g, Rfl: 0   dicyclomine (BENTYL) 20 MG tablet, Take 1 tablet (20 mg total) by mouth 4 (four) times daily as needed for spasms., Disp: 120 tablet, Rfl: 2   polyethylene glycol (MIRALAX / GLYCOLAX) 17 g packet, Take 17 g by mouth daily., Disp: , Rfl:    Prenatal-DSS-FeCb-FeGl-FA (CITRANATAL BLOOM PO), Take by mouth., Disp: , Rfl:    rifaximin (XIFAXAN) 550 MG TABS tablet, Take 1 tablet (550 mg total) by mouth 3 (three) times daily., Disp: 42 tablet, Rfl: 0   SUMAtriptan (IMITREX) 100 MG tablet, Take 1 tablet earliest onset of migraine.  May repeat in 2 hours if headache persists or recurs.  Maximum 2 tablets in 24 hours., Disp: 10 tablet, Rfl: 11   Tenapanor HCl (IBSRELA) 50 MG TABS, Take 50 mg by mouth 2 (two) times daily. BIN: [id number], PCN: [id number], Grp: [id number], ID: [id number], Disp: 180 tablet, Rfl: 3   Medications ordered in this encounter:  Meds ordered this encounter  Medications   brompheniramine-pseudoephedrine-DM 30-2-10 MG/5ML syrup    Sig: Take 5 mLs by mouth 4  (four) times daily as needed.    Dispense:  120 mL    Refill:  0    Order Specific Question:   Supervising Provider    Answer:   Merrilee Jansky [[id number]]   fluticasone (FLONASE) 50 MCG/ACT nasal spray    Sig: Place 2 sprays into both nostrils daily.    Dispense:  16 g    Refill:  0    Order Specific Question:   Supervising Provider    Answer:   Merrilee Jansky [id number]     *If you need refills on other medications prior to your next appointment, please contact your pharmacy*  Follow-Up: Call back or seek an in-person evaluation if the symptoms worsen or if the condition fails to improve as anticipated.  [facility] [phone]  Other Instructions  URI recommendations:  test for fever and covid before going around others  - Increased rest - Increasing Fluids - Acetaminophen / ibuprofen as needed for fever/pain.  - Salt water gargling, chloraseptic spray and throat lozenges - Mucinex if mucus is present and increasing.  - Saline nasal spray if congestion or if nasal passages feel dry. - Humidifying the air.     If you have been instructed to have an in-person evaluation today at a local Urgent Care facility, please use the link below. It will take you to a list of all of our available [facility], including address, phone number and hours of operation. Please do not delay care.  [facility]  If you or a family member do not have a primary care provider, use the link below to schedule a visit and establish care. When you choose a [facility] primary care physician or advanced practice provider, you gain a long-term partner in health. Find a Primary Care Provider  Learn more about [facility]'s in-office and virtual care options: [facility] - Get Care Now

## 2023-01-21 NOTE — Progress Notes (Signed)
Virtual Visit Consent   Shannon Mccoy, you are scheduled for a virtual visit with a [facility] provider today. Just as with appointments in the office, your consent must be obtained to participate. Your consent will be active for this visit and any virtual visit you may have with one of our providers in the next 365 days. If you have a MyChart account, a copy of this consent can be sent to you electronically.  As this is a virtual visit, video technology does not allow for your provider to perform a traditional examination. This may limit your provider's ability to fully assess your condition. If your provider identifies any concerns that need to be evaluated in person or the need to arrange testing (such as labs, EKG, etc.), we will make arrangements to do so. Although advances in technology are sophisticated, we cannot ensure that it will always work on either your end or our end. If the connection with a video visit is poor, the visit may have to be switched to a telephone visit. With either a video or telephone visit, we are not always able to ensure that we have a secure connection.  By engaging in this virtual visit, you consent to the provision of healthcare and authorize for your insurance to be billed (if applicable) for the services provided during this visit. Depending on your insurance coverage, you may receive a charge related to this service.  I need to obtain your verbal consent now. Are you willing to proceed with your visit today? Shannon Mccoy has provided verbal consent on 01/21/2023 for a virtual visit (video or telephone). Freddy Finner, NP  Date: 01/21/2023 8:05 AM  Virtual Visit via Video Note   I, Freddy Finner, connected with  Shannon Mccoy  ([id number], [birth year]) on 01/21/23 at  8:15 AM EST by a video-enabled telemedicine application and verified that I am speaking with the correct person using two identifiers.  Location: Patient: Virtual  Visit Location Patient: Home Provider: Virtual Visit Location Provider: Home Office   I discussed the limitations of evaluation and management by telemedicine and the availability of in person appointments. The patient expressed understanding and agreed to proceed.    History of Present Illness: Shannon Mccoy is a 27 y.o. who identifies as a female who was assigned female at birth, and is being seen today for cough  Onset was Monday with a cough thought it was allergies Associated symptoms are cough with green mucus, chest hurts with coughing a lot, drainage in back of throat Modifying factors are vicks vapor cold and flu meds  Denies chest pain, shortness of breath, fevers, chills  Exposure to sick contacts- unknown COVID test: no  Vaccines: Flu up dated, no covid booster  Problems:  Patient Active Problem List   Diagnosis Date Noted   Frequent headaches 11/26/2020   Overweight with body mass index (BMI) of 27 to 27.9 in adult 09/18/2020   Other constipation 12/10/2016   IBS (irritable bowel syndrome) 10/30/2015    Allergies:  Allergies  Allergen Reactions   Fish Allergy Nausea And Vomiting   Food Nausea And Vomiting    Coconut, fish, mushrooms   Lactose Intolerance (Gi)     bloating   Latex Hives and Itching   Sulfa Antibiotics     Unknown reaction.    Elemental Sulfur Rash   Medications:  Current Outpatient Medications:    dicyclomine (BENTYL) 20 MG tablet, Take 1 tablet (20 mg total) by mouth  4 (four) times daily as needed for spasms., Disp: 120 tablet, Rfl: 2   polyethylene glycol (MIRALAX / GLYCOLAX) 17 g packet, Take 17 g by mouth daily., Disp: , Rfl:    Prenatal-DSS-FeCb-FeGl-FA (CITRANATAL BLOOM PO), Take by mouth., Disp: , Rfl:    rifaximin (XIFAXAN) 550 MG TABS tablet, Take 1 tablet (550 mg total) by mouth 3 (three) times daily., Disp: 42 tablet, Rfl: 0   SUMAtriptan (IMITREX) 100 MG tablet, Take 1 tablet earliest onset of migraine.  May repeat in 2  hours if headache persists or recurs.  Maximum 2 tablets in 24 hours., Disp: 10 tablet, Rfl: 11   Tenapanor HCl (IBSRELA) 50 MG TABS, Take 50 mg by mouth 2 (two) times daily. BIN: [id number], PCN: [id number], Grp: [id number], ID: [id number], Disp: 180 tablet, Rfl: 3  Observations/Objective: Patient is well-developed, well-nourished in no acute distress.  Resting comfortably  at home.  Head is normocephalic, atraumatic.  No labored breathing.  Speech is clear and coherent with logical content.  Patient is alert and oriented at baseline.    Assessment and Plan:  1. Viral URI with cough  - brompheniramine-pseudoephedrine-DM 30-2-10 MG/5ML syrup; Take 5 mLs by mouth 4 (four) times daily as needed.  Dispense: 120 mL; Refill: 0 - fluticasone (FLONASE) 50 MCG/ACT nasal spray; Place 2 sprays into both nostrils daily.  Dispense: 16 g; Refill: 0  URI recommendations: - Increased rest - Increasing Fluids - Acetaminophen / ibuprofen as needed for fever/pain.  - Salt water gargling, chloraseptic spray and throat lozenges - Mucinex if mucus is present and increasing.  - Saline nasal spray if congestion or if nasal passages feel dry. - Humidifying the air.   Reviewed side effects, risks and benefits of medication.    Patient acknowledged agreement and understanding of the plan.   Past Medical, Surgical, Social History, Allergies, and Medications have been Reviewed.   Follow Up Instructions: I discussed the assessment and treatment plan with the patient. The patient was provided an opportunity to ask questions and all were answered. The patient agreed with the plan and demonstrated an understanding of the instructions.  A copy of instructions were sent to the patient via MyChart unless otherwise noted below.    The patient was advised to call back or seek an in-person evaluation if the symptoms worsen or if the condition fails to improve as anticipated.    Freddy Finner, NP

## 2023-01-26 ENCOUNTER — Other Ambulatory Visit (HOSPITAL_COMMUNITY): Payer: Self-pay

## 2023-01-26 ENCOUNTER — Encounter (HOSPITAL_COMMUNITY): Payer: Self-pay

## 2023-01-26 MED ORDER — IBSRELA 50 MG PO TABS
1.0000 | ORAL_TABLET | Freq: Two times a day (BID) | ORAL | 3 refills | Status: DC
  Filled 2023-01-26: qty 180, 90d supply, fill #0

## 2023-01-27 ENCOUNTER — Other Ambulatory Visit (HOSPITAL_COMMUNITY): Payer: Self-pay

## 2023-01-28 ENCOUNTER — Other Ambulatory Visit (HOSPITAL_COMMUNITY): Payer: Self-pay

## 2023-01-28 ENCOUNTER — Other Ambulatory Visit: Payer: Self-pay

## 2023-01-28 MED ORDER — IBSRELA 50 MG PO TABS
50.0000 mg | ORAL_TABLET | Freq: Two times a day (BID) | ORAL | 3 refills | Status: DC
Start: 2022-10-21 — End: 2023-05-27
  Filled 2023-01-28: qty 180, 90d supply, fill #0
  Filled 2023-02-03: qty 60, 30d supply, fill #0

## 2023-02-03 ENCOUNTER — Other Ambulatory Visit: Payer: Self-pay

## 2023-02-03 ENCOUNTER — Other Ambulatory Visit (HOSPITAL_COMMUNITY): Payer: Self-pay

## 2023-02-03 ENCOUNTER — Encounter: Payer: Self-pay | Admitting: Gastroenterology

## 2023-02-23 ENCOUNTER — Other Ambulatory Visit: Payer: Self-pay

## 2023-03-29 ENCOUNTER — Telehealth: Payer: Self-pay | Admitting: Gastroenterology

## 2023-03-29 NOTE — Telephone Encounter (Signed)
Inbound call from patient stating she is having complications refilling her Ibsrela medication. Requesting a call back. Please advise, thank you.

## 2023-04-01 ENCOUNTER — Other Ambulatory Visit: Payer: Self-pay | Admitting: Gastroenterology

## 2023-04-01 NOTE — Telephone Encounter (Signed)
 Called [facility] [place] where script was sent last. They indicated she was sent a message to see if she wanted it but they have not heard back.  Apparently patient was never able to pick it up back in October.  When the pharmacist ran it today, it said the patient is not covered so perhaps she has new insurance for this year. Called and left detailed message for patient asking her to confirm her current insurance.  Also let her know we have samples we can provide until we get this sorted.  She may be required to use a [facility] pharmacy according to [facility] who got the PA approved last year. See TE from 12-08-22

## 2023-04-01 NOTE — Telephone Encounter (Signed)
 Amitiza not covered - please initiate a PA

## 2023-04-09 ENCOUNTER — Other Ambulatory Visit: Payer: Self-pay

## 2023-04-09 MED ORDER — IBSRELA 50 MG PO TABS: 1.0000 | ORAL_TABLET | Freq: Two times a day (BID) | ORAL | 3 refills | Status: DC

## 2023-04-09 NOTE — Telephone Encounter (Signed)
Patient returned call. Patient stated that she was able to update her insurance with [facility] and they just needed a new prescription. Patient also stated that she would have her parents come by to pick up samples of IBSrella.

## 2023-04-09 NOTE — Telephone Encounter (Signed)
Left VM for patient to return call regarding her current insurance and letting her know that we have samples in the office available for her to pick up.

## 2023-04-12 ENCOUNTER — Telehealth: Payer: Self-pay

## 2023-04-12 ENCOUNTER — Other Ambulatory Visit (HOSPITAL_COMMUNITY): Payer: Self-pay

## 2023-04-12 NOTE — Telephone Encounter (Signed)
 Per [facility] patient's ibsrela requires a PA. Can I get a PA on this please? Her insurance is [facility] card scanned in chart

## 2023-04-12 NOTE — Telephone Encounter (Signed)
 Inbound call to discuss prescription. Please advise.

## 2023-04-12 NOTE — Telephone Encounter (Signed)
 Pharmacy Patient Advocate Encounter   Received notification from CoverMyMeds that prior authorization for Ibsrela 50 mg tablets is required/requested.   Insurance verification completed.   The patient is insured through [facility] .   Per test claim: PA required; PA started via CoverMyMeds. KEY [id number] . Waiting for clinical questions to populate.

## 2023-04-12 NOTE — Telephone Encounter (Signed)
 Patient samples are up there and I told her. [facility] said [facility] said [facility] will be over 1000 dollars for the medication each time she comes. She said [facility] she didn't get her medication like she was suppose to and requested that we call her insurance. Please advise

## 2023-04-13 ENCOUNTER — Other Ambulatory Visit (HOSPITAL_COMMUNITY): Payer: Self-pay

## 2023-04-13 NOTE — Telephone Encounter (Signed)
 Prior auth questions completed.

## 2023-04-16 NOTE — Telephone Encounter (Signed)
 RX is written for [facility] PA request has been Submitted. New Encounter created for follow up. For additional info see Pharmacy Prior Auth telephone encounter from 02/17.

## 2023-04-16 NOTE — Telephone Encounter (Signed)
 Pharmacy Patient Advocate Encounter  Received notification from [facility] that Prior Authorization for Shannon Mccoy has been DENIED.  Full denial letter will be uploaded to the media tab. See denial reason below.   PA #/Case ID/Reference #: The denial was based on our criteria for Ibsrela Tab 50mg .  Per your health plan's criteria, this drug is covered if you meet the following: (1) There are paid claims or your doctor provides medical records (for example: chart notes) showing that you have tried (minimum 30 days supply) or cannot take one of the following: Generic lactulose or generic polyethylene glycol. (2) There are paid claims or your doctor provides medical records (for example: chart notes) showing that you have tried (minimum 30 days supply) or cannot take both of the following: Linzess and generic lubiprostone (Amitiza). The information provided does not show that you meet the criteria listed above. Please speak with your doctor about your options

## 2023-04-16 NOTE — Telephone Encounter (Signed)
 PA request has been Denied. New Encounter created for follow up. For additional info see Pharmacy Prior Auth telephone encounter from 02/17.

## 2023-04-19 NOTE — Telephone Encounter (Signed)
 Dr Tomasa Rand can you please advise regarding this denial?

## 2023-04-20 NOTE — Telephone Encounter (Signed)
 Please redo the PA for this medication the doctor says she mets criteria

## 2023-04-23 NOTE — Telephone Encounter (Signed)
 Somehow ended up in my box - looks like a PA has been started

## 2023-05-17 ENCOUNTER — Telehealth: Payer: Self-pay

## 2023-05-17 NOTE — Telephone Encounter (Signed)
 Pharmacy Patient Advocate Encounter   Received notification from CoverMyMeds that prior authorization for Ibsrela 50MG  tablets is required/requested.   Insurance verification completed.   The patient is insured through [facility] .   Per test claim: PA required; PA submitted to above mentioned insurance via CoverMyMeds Key/confirmation #/EOC [id number] Status is pending  Resubmission per provider request

## 2023-05-18 ENCOUNTER — Telehealth: Payer: Self-pay | Admitting: Pharmacist

## 2023-05-18 NOTE — Telephone Encounter (Signed)
 Pharmacy Patient Advocate Encounter  Insurance will not allow resubmission online and wants an appeal to be processed.  Information has been sent to clinical pharmacist for appeals review. It may take 5-7 days to prepare the necessary documentation to request the appeal from the insurance.

## 2023-05-18 NOTE — Telephone Encounter (Signed)
 E-Appeal has been submitted for Ibsrela. Will advise when response is received, please be advised that most companies may take 30 days to make a decision. Appeal letter and supporting documentation have been uploaded and submitted via [employer] website on 05/18/2023.  Thank you, Dellie Burns, PharmD Clinical Pharmacist  [facility]  Direct Dial: [phone]

## 2023-05-20 NOTE — Telephone Encounter (Signed)
 Pharmacy Patient Advocate Encounter  Received notification from [facility] that Prior Authorization APPEAL for Ibsrela 50MG  tablets has been APPROVED from 04-12-2023 to 08-19-2023   PA #/Case ID/Reference #: [id number]

## 2023-05-25 ENCOUNTER — Telehealth: Payer: Self-pay

## 2023-05-25 NOTE — Telephone Encounter (Signed)
 Shannon Mccoy the pt is wanting to know if [facility]? Looks like appeal was with [employer] [facility]. Please let me know, thanks.

## 2023-05-27 ENCOUNTER — Other Ambulatory Visit: Payer: Self-pay

## 2023-05-27 MED ORDER — IBSRELA 50 MG PO TABS: 50.0000 mg | ORAL_TABLET | Freq: Two times a day (BID) | ORAL | 6 refills | Status: DC

## 2023-05-27 NOTE — Telephone Encounter (Signed)
 Patient should be able to fill where she wants as long as her insurance does not have any restrictions.

## 2023-05-27 NOTE — Telephone Encounter (Signed)
 Shannon Mccoy

## 2023-06-18 ENCOUNTER — Encounter: Payer: Self-pay | Admitting: Gastroenterology

## 2023-06-22 NOTE — Telephone Encounter (Signed)
 Patient requesting to speak with a nurse in regards to abdominal pain. Please advise.   Thank you

## 2023-06-22 NOTE — Telephone Encounter (Signed)
 Left a message for pt to call back

## 2023-06-22 NOTE — Telephone Encounter (Signed)
 Pt states she wants to see Dr Jeanetta Milian. Scheduled her to see Dr Cherryl Corona 06/23/23@9 :30am, see mychart message.

## 2023-06-23 ENCOUNTER — Encounter: Payer: Self-pay | Admitting: Gastroenterology

## 2023-06-23 ENCOUNTER — Ambulatory Visit: Admitting: Gastroenterology

## 2023-06-23 VITALS — BP 112/62 | HR 83 | Ht 65.0 in | Wt 205.0 lb

## 2023-06-23 DIAGNOSIS — K521 Toxic gastroenteritis and colitis: Secondary | ICD-10-CM

## 2023-06-23 DIAGNOSIS — K581 Irritable bowel syndrome with constipation: Secondary | ICD-10-CM

## 2023-06-23 DIAGNOSIS — T65891A Toxic effect of other specified substances, accidental (unintentional), initial encounter: Secondary | ICD-10-CM

## 2023-06-23 DIAGNOSIS — R142 Eructation: Secondary | ICD-10-CM | POA: Diagnosis not present

## 2023-06-23 MED ORDER — DICYCLOMINE HCL 20 MG PO TABS: 20.0000 mg | ORAL_TABLET | Freq: Four times a day (QID) | ORAL | 2 refills | Status: DC | PRN

## 2023-06-23 NOTE — Patient Instructions (Addendum)
 Decrease Ibsrella to once daily.  Take xifaxan  samples for a week.   _______________________________________________________  If your blood pressure at your visit was 140/90 or greater, please contact your primary care physician to follow up on this.  _______________________________________________________  If you are age 27 or older, your body mass index should be between 23-30. Your Body mass index is 34.11 kg/m. If this is out of the aforementioned range listed, please consider follow up with your Primary Care Provider.  If you are age 39 or younger, your body mass index should be between 19-25. Your Body mass index is 34.11 kg/m. If this is out of the aformentioned range listed, please consider follow up with your Primary Care Provider.   ________________________________________________________  The [facility] providers would like to encourage you to use MYCHART to communicate with providers for non-urgent requests or questions.  Due to long hold times on the telephone, sending your provider a message by [facility] may be a faster and more efficient way to get a response.  Please allow 48 business hours for a response.  Please remember that this is for non-urgent requests.  _______________________________________________________   _______________________________________________________  If your blood pressure at your visit was 140/90 or greater, please contact your primary care physician to follow up on this.  _______________________________________________________  If you are age 71 or older, your body mass index should be between 23-30. Your Body mass index is 34.11 kg/m. If this is out of the aforementioned range listed, please consider follow up with your Primary Care Provider.  If you are age 70 or younger, your body mass index should be between 19-25. Your Body mass index is 34.11 kg/m. If this is out of the aformentioned range listed, please consider follow up with your Primary  Care Provider.   ________________________________________________________  The [facility] providers would like to encourage you to use MYCHART to communicate with providers for non-urgent requests or questions.  Due to long hold times on the telephone, sending your provider a message by [facility] may be a faster and more efficient way to get a response.  Please allow 48 business hours for a response.  Please remember that this is for non-urgent requests.  _______________________________________________________   It was a pleasure to see you today!  Thank you for trusting me with your gastrointestinal care!    Scott E.Cherryl Corona, MD

## 2023-06-23 NOTE — Progress Notes (Signed)
 Discussed the use of AI scribe software for clinical note transcription with the patient, who gave verbal consent to proceed.  HPI : Shannon Mccoy is a 28 y.o. female with known IBS who presents for follow-up.  She was last seen by me in August 2024 with ongoing symptoms of bloating and constipation.  She was prescribed Ibsrela , which was initially denied by her insurance, but subsequently approved after appeal.  This process took many months, and she just started taking the Ibsrela  about a month ago.  She has been experiencing diarrhea for the past week, occurring immediately after eating. The diarrhea is liquid and occurs more than three times a day. She experiences stomach cramping before bowel movements, which improves after evacuation. She also uses Bentyl  for muscle spasms during her menstrual cycle but has not been taking it regularly.  She reports experiencing foul-smelling burps, primarily in the evening.  She states that her burps smell like "poop".  She has tried taking Pepcid without relief. No heartburn, burning pain in the chest, or acid taste in the mouth. She recalls similar symptoms a few years ago that resolved on their own, but notes this episode is lingering longer.  She mentions a burning sensation in her stomach that occurred last night and this morning, worsening with eating. 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 She has not taken any other acid-reducing medications besides Pepcid.  In terms of diet, she tries to eat healthily, with meals typically consisting of hamburger meat or chicken with vegetables. She has improved her breakfast habits but notes that bacon has recently caused discomfort, so she avoids it. She previously tried a low FODMAP diet which had helped with her GI symptoms but stopped due to weight loss.  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      Evaluation of abdominal pain, early satiety, and bloating in the setting of ongoing constipation with a sense of incomplete evacuation has included:  - Labs 08/02/18: normal TSH, ESR, CRP, Giardia, and fecal calprotectin - Labs 03/06/20: food allergen profile negative - EGD 05/21/20 showed mild chronic gastritis and was otherwise normal. Biopsies were negative for H pylori. Duodenal biopsies were normal.  - Colonoscopy performed to distal ascending colon due to looping was normal.  - CT abd/pelvis with contrast 07/08/21 showed a 4.8 cm adnexal simple-appearing cyst but was otherwise negative - Labs 08/22/21: H pylori stool antigen negative   On office visit 07/14/21 her symptoms were better controlled with low FODMAP diet, avoiding dairy, use of a squatty potty and exercise than any medications that she had tried in the past. She was having one formed bowel movement daily using Miralax daily and increasing her daily water consumption.   Office visit 08/20/21 she reported concerns for 2 weeks of malodorous eructation that resolved last month without intervention. Symptoms were occurring intermittently during the time she had a ruptured ovarian cyst. She continues to have intermittent bloating but denies abdominal pain or change in bowel habits. She thought it might be acid reflux but she noticed no improvement with Tums. No change in diet or increase in stress. No significant alcohol intake.   Office visit August 2024, she reported ongoing symptoms of abdominal bloating, hard stools and difficult bowel movements.  She was prescribed Ibsrela , which was initially denied, but finally approved after an appeal.  Endoscopic history: - EGD 05/21/20 showed mild chronic gastritis and was otherwise normal. Biopsies were negative for H pylori. Duodenal biopsies were normal.  - Colonoscopy performed to distal ascending colon due to looping was  normal.    Past Medical History:  Diagnosis Date   Allergy    IBS (irritable bowel syndrome)    Migraines    Otitis media      Past Surgical History:  Procedure Laterality Date   COLONOSCOPY  05/21/2020   TYMPANOTOMY     UPPER GASTROINTESTINAL ENDOSCOPY  05/21/2020   Family History  Problem Relation Age of Onset   Migraines Mother    Celiac disease Brother    Breast cancer Maternal Aunt        age unknown   Dementia Maternal Grandmother    Hyperlipidemia Maternal Grandfather    Kidney disease Maternal Grandfather    Liver cancer Maternal Great-grandfather    Prostate cancer Maternal Great-grandfather    Breast cancer Maternal Great-grandmother    Diabetes Maternal Great-grandmother    Breast cancer Other        great aunt    Esophageal cancer Neg Hx    Rectal cancer Neg Hx    Colon cancer Neg Hx    Colon polyps Neg Hx    Stomach cancer Neg Hx    Social History   Tobacco Use   Smoking status: Never   Smokeless tobacco: Never  Vaping Use   Vaping status: Never Used  Substance Use Topics   Alcohol use: No   Drug use: No   Current Outpatient Medications  Medication Sig Dispense Refill   brompheniramine-pseudoephedrine-DM 30-2-10 MG/5ML syrup Take 5 mLs by mouth 4 (four) times daily as needed. 120 mL 0   dicyclomine  (BENTYL ) 20 MG tablet Take 1 tablet (20 mg total) by mouth 4 (four) times daily as needed for spasms. 120 tablet 2   fluticasone  (FLONASE ) 50 MCG/ACT nasal spray Place 2 sprays into both nostrils daily. 16 g 0   polyethylene glycol (MIRALAX / GLYCOLAX) 17 g packet Take 17 g by mouth daily.     Prenatal-DSS-FeCb-FeGl-FA (CITRANATAL BLOOM PO) Take by mouth.     rifaximin  (XIFAXAN ) 550 MG TABS tablet Take 1 tablet (550 mg total) by mouth 3 (three) times daily. 42 tablet 0   SUMAtriptan  (IMITREX ) 100 MG tablet Take 1 tablet earliest onset of migraine.  May repeat in 2 hours if headache persists or recurs.  Maximum 2 tablets in 24 hours. 10 tablet 11    Tenapanor  HCl (IBSRELA ) 50 MG TABS Take 1 tablet by mouth 2 (two) times daily. 180 tablet 3   Tenapanor  HCl (IBSRELA ) 50 MG TABS Take 50 mg by mouth 2 (two) times daily. 60 tablet 6   No current facility-administered medications for this visit.   Allergies  Allergen Reactions   Fish Allergy Nausea And Vomiting   Food Nausea And Vomiting    Coconut, fish, mushrooms   Lactose Intolerance (Gi)     bloating   Latex Hives and Itching   Sulfa Antibiotics     Unknown reaction.    Elemental Sulfur Rash     Review of Systems: All systems reviewed and negative except where noted in HPI.    No results found.  Physical Exam: BP 112/62   Pulse 83   Ht 5\' 5"  (1.651 m)   Wt 205 lb (93 kg)   BMI 34.11 kg/m  Constitutional: Pleasant,well-developed, Caucasian female in no acute distress. HEENT: Normocephalic and atraumatic. Conjunctivae  are normal. No scleral icterus. Neck supple.  Cardiovascular: Normal rate, regular rhythm.  Pulmonary/chest: Effort normal and breath sounds normal. No wheezing, rales or rhonchi. Abdominal: Soft, nondistended, TTP in the bilateral lower quadrants without rigidity or guarding. Bowel sounds active throughout. There are no masses palpable. No hepatomegaly. Extremities: no edema Lymphadenopathy: No cervical adenopathy noted. Neurological: Alert and oriented to person place and time. Skin: Skin is warm and dry. No rashes noted. Psychiatric: Normal mood and affect. Behavior is normal.  CBC    Component Value Date/Time   WBC 13.2 (H) 07/08/2021 0416   RBC 4.83 07/08/2021 0416   HGB 13.8 07/08/2021 0416   HCT 42.6 07/08/2021 0416   PLT 403 (H) 07/08/2021 0416   MCV 88.2 07/08/2021 0416   MCH 28.6 07/08/2021 0416   MCHC 32.4 07/08/2021 0416   RDW 12.4 07/08/2021 0416   LYMPHSABS 1.7 05/20/2010 1353   MONOABS 0.9 05/20/2010 1353   EOSABS 0.1 05/20/2010 1353   BASOSABS 0.0 05/20/2010 1353    CMP     Component Value Date/Time   NA 138 07/08/2021  0416   K 3.6 07/08/2021 0416   CL 105 07/08/2021 0416   CO2 26 07/08/2021 0416   GLUCOSE 92 07/08/2021 0416   BUN 10 07/08/2021 0416   CREATININE 0.64 07/08/2021 0416   CREATININE 0.71 04/15/2018 1523   CALCIUM 9.4 07/08/2021 0416   PROT 7.4 07/08/2021 0416   ALBUMIN 4.2 07/08/2021 0416   AST 16 07/08/2021 0416   ALT 23 07/08/2021 0416   ALKPHOS 53 07/08/2021 0416   BILITOT 0.6 07/08/2021 0416   GFRNONAA >60 07/08/2021 0416   GFRAA >60 10/22/2015 1528       Latest Ref Rng & Units 07/08/2021    4:16 AM 03/06/2020    9:49 AM 04/15/2018    3:23 PM  CBC EXTENDED  WBC 4.0 - 10.5 K/uL 13.2  11.5  11.7   RBC 3.87 - 5.11 MIL/uL 4.83  4.79  5.14   Hemoglobin 12.0 - 15.0 g/dL 16.1  09.6  04.5   HCT 36.0 - 46.0 % 42.6  40.4  44.0   Platelets 150 - 400 K/uL 403  400.0  498       ASSESSMENT AND PLAN:   IBS-C Intermittent abdominal pain with burning sensation, primarily nocturnal and exacerbated by food intake. Pain is bilateral with occasional cramping pre-defecation. No heartburn or acid taste. Symptoms may be related to recent night shift adjustment and stress. - Provide low FODMAP diet guidelines for dietary management. - Recommend reinitiating low FODMAP diet  Burning sensation in stomach Burning sensation primarily nocturnal and morning, exacerbated by food intake. No heartburn or acid taste. Symptoms may be related to recent night shift adjustment and stress. - Refill Bentyl   Foul-smelling burps Foul-smelling burps more frequent in the evening. Pepcid ineffective. No heartburn or acid taste.  With previous response to rifaximin , will give a week course with office samples on hand.   - Repeat course of rifaximin  - Low FODMAP diet  Diarrhea due to Ibsrela  Diarrhea likely secondary to Ibsrela , occurring >3 times daily with urgency and liquid consistency. Present for about a week. Diarrhea may decrease as the body adjusts to the medication over 6-8 weeks. Consider adjusting  dosing schedule to once daily or every other day to manage symptoms. - Continue Ibsrela  for 6-8 weeks if tolerable. - Recommend decreasing dose to once daily or every other day until diarrhea improves. - Reassess Ibsrela  use if diarrhea continues after  6-8 weeks.        Reeves Canter I, NP

## 2023-08-09 ENCOUNTER — Other Ambulatory Visit (HOSPITAL_COMMUNITY): Payer: Self-pay

## 2023-08-09 ENCOUNTER — Telehealth: Payer: Self-pay

## 2023-08-09 NOTE — Telephone Encounter (Signed)
 Pharmacy Patient Advocate Encounter   Received notification from CoverMyMeds that prior authorization for Ibsrela  50MG  tablets is required/requested.   Insurance verification completed.   The patient is insured through [facility] .   Prior Authorization for Ibsrela  50MG  tablets has been APPROVED from 08-09-2023 to 08-08-2024   PA #/Case ID/Reference #: [id number]

## 2023-09-24 ENCOUNTER — Other Ambulatory Visit: Payer: Self-pay | Admitting: Gastroenterology

## 2023-09-24 DIAGNOSIS — K581 Irritable bowel syndrome with constipation: Secondary | ICD-10-CM

## 2023-10-01 ENCOUNTER — Telehealth: Payer: Self-pay

## 2023-10-01 ENCOUNTER — Other Ambulatory Visit (HOSPITAL_COMMUNITY): Payer: Self-pay

## 2023-10-01 NOTE — Telephone Encounter (Signed)
 Pharmacy Patient Advocate Encounter   Received notification from CoverMyMeds that prior authorization for Ibsrela  50MG  tablets is required/requested.   Insurance verification completed.   The patient is insured through [facility] .   Per test claim: The current 30 day co-pay is, $0.00.  No PA needed at this time. This test claim was processed through [facility]- copay amounts may vary at other pharmacies due to pharmacy/plan contracts, or as the patient moves through the different stages of their insurance plan.

## 2023-10-21 ENCOUNTER — Encounter: Payer: Self-pay | Admitting: Gastroenterology

## 2023-10-22 ENCOUNTER — Other Ambulatory Visit: Payer: Self-pay

## 2023-10-22 ENCOUNTER — Other Ambulatory Visit (HOSPITAL_COMMUNITY): Payer: Self-pay

## 2023-10-22 NOTE — Telephone Encounter (Signed)
 I just ran a test claim to verify the cost. If [facility], the co-pay comes up at $0.00.

## 2023-10-26 ENCOUNTER — Other Ambulatory Visit: Payer: Self-pay

## 2023-10-26 ENCOUNTER — Other Ambulatory Visit (HOSPITAL_BASED_OUTPATIENT_CLINIC_OR_DEPARTMENT_OTHER): Payer: Self-pay

## 2023-10-26 MED ORDER — IBSRELA 50 MG PO TABS
50.0000 mg | ORAL_TABLET | Freq: Two times a day (BID) | ORAL | 6 refills | Status: AC
  Filled 2023-10-26: qty 60, 30d supply, fill #0
  Filled 2023-11-22: qty 60, 30d supply, fill #1
  Filled 2024-01-07: qty 60, 30d supply, fill #2
  Filled 2024-02-14: qty 60, 30d supply, fill #3
  Filled 2024-03-17: qty 180, 90d supply, fill #4

## 2023-10-26 NOTE — Addendum Note (Signed)
 Addended by: CLAUDENE NAOMIE SAILOR on: 10/26/2023 08:12 AM   Modules accepted: Orders

## 2023-10-27 ENCOUNTER — Other Ambulatory Visit (HOSPITAL_BASED_OUTPATIENT_CLINIC_OR_DEPARTMENT_OTHER): Payer: Self-pay

## 2023-10-28 ENCOUNTER — Other Ambulatory Visit (HOSPITAL_BASED_OUTPATIENT_CLINIC_OR_DEPARTMENT_OTHER): Payer: Self-pay

## 2023-11-02 ENCOUNTER — Other Ambulatory Visit (HOSPITAL_BASED_OUTPATIENT_CLINIC_OR_DEPARTMENT_OTHER): Payer: Self-pay

## 2023-11-04 ENCOUNTER — Ambulatory Visit: Payer: Commercial Managed Care - PPO | Admitting: Neurology

## 2023-11-22 ENCOUNTER — Other Ambulatory Visit (HOSPITAL_BASED_OUTPATIENT_CLINIC_OR_DEPARTMENT_OTHER): Payer: Self-pay

## 2023-12-09 DIAGNOSIS — E66811 Obesity, class 1: Secondary | ICD-10-CM | POA: Diagnosis not present

## 2023-12-09 DIAGNOSIS — Z6834 Body mass index (BMI) 34.0-34.9, adult: Secondary | ICD-10-CM | POA: Diagnosis not present

## 2023-12-09 DIAGNOSIS — E6609 Other obesity due to excess calories: Secondary | ICD-10-CM | POA: Diagnosis not present

## 2023-12-10 NOTE — Progress Notes (Unsigned)
 NEUROLOGY FOLLOW UP OFFICE NOTE  Shannon Mccoy [id number]  Assessment/Plan:   Migraine without aura, without status migrainosus, not intractable    Migraine prevention:  Plan to start Nurtec 75mg  every other day.  Migraine rescue:  Will provide samples of Ubrelvy 100mg  and she will update me on efficacy. Lifestyle modification: Limit use of pain relievers to no more than 9 days out of the month to prevent risk of rebound or medication-overuse headache. Diet modification/hydration/caffeine  cessation Routine exercise Sleep hygiene Consider vitamins/supplements:  magnesium citrate 400mg  daily, riboflavin 400mg  daily, CoQ10 100mg  three times daily Keep headache diary Follow up 6 months.      Subjective:  Shannon Mccoy is a 28 year old female with IBS who follows up for migraine.  UPDATE: She started experiencing increased headaches over the past few months.  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   Intensity:  10/10 Duration:  1-3 days with sumatriptan  Frequency:  1 to 2 times a week (10 headache days a month) Went to the chiropractor - manipulation helps for a short time.     Frequency of abortive medication: none.  Not taking anything for the headaches. Current NSAIDS/analgesics:  none Current triptans:  none Current ergotamine:  none Current anti-emetic:  none Current muscle relaxants:  none Current Antihypertensive medications:  none Current Antidepressant medications:  none Current Anticonvulsant medications:  none Current anti-CGRP:  none Current Vitamins/Herbal/Supplements:  none Current Antihistamines/Decongestants:  none Other therapy:  none Hormone/birth control:  none  Caffeine :  1 medium coffee daily Diet:  Improved diet.   Exercise:  has decreased frequency of exercise.  Tries to go to gym on days off, otherwise has home workouts. Depression:  none;  Anxiety:  yes Other pain:  no Sleep hygiene:  Usually okay.  More than 8 hours when working because she sleeps during the day.  When not working, she sleeps from 8PM to 6:30-7AM.    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  She denies nausea, vomiting, dizziness, visual disturbance, associated unilateral numbness or weakness. Duration:  1 to 2 days (rarely 3 days) Frequency:  always a daily headache.  Severe headaches occur on average 7 days a month Frequency of abortive medication: none Triggers:  stress (especially work-related stress in the ED), certain foods that trigger IBS Relieving factors:  resting in dark and quiet room Activity:  cannot function if severe.    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 120 mL 0   dicyclomine  (BENTYL ) 20 MG tablet TAKE 1 TABLET (20  MG TOTAL) BY MOUTH 4 (FOUR) TIMES DAILY AS NEEDED FOR SPASMS. 360 tablet 1   fluticasone  (FLONASE ) 50 MCG/ACT nasal spray Place 2 sprays into both nostrils daily. 16 g 0   polyethylene glycol (MIRALAX / GLYCOLAX) 17 g packet Take 17 g by mouth daily.     Prenatal-DSS-FeCb-FeGl-FA (CITRANATAL BLOOM PO) Take by mouth.     rifaximin  (XIFAXAN ) 550 MG TABS tablet Take 1 tablet (550 mg total) by mouth 3 (three) times daily. 42 tablet 0   SUMAtriptan  (IMITREX ) 100 MG tablet Take 1 tablet earliest onset of migraine.  May repeat in 2 hours if headache persists or recurs.  Maximum 2 tablets in 24 hours. 10 tablet 11   Tenapanor  HCl (IBSRELA ) 50 MG TABS Take 1 tablet (50 mg) by mouth 2 (two) times daily. 60 tablet 6   No current facility-administered medications on file prior to visit.    ALLERGIES: Allergies  Allergen Reactions   Fish Allergy Nausea And Vomiting   Food Nausea And Vomiting    Coconut, fish, mushrooms   Lactose Intolerance (Gi)     bloating   Latex Hives and Itching   Sulfa Antibiotics     Unknown reaction.    Elemental Sulfur Rash    FAMILY HISTORY: Family History  Problem Relation Age of Onset   Migraines Mother    Celiac disease Brother    Breast cancer Maternal Aunt        age unknown   Dementia Maternal Grandmother    Hyperlipidemia Maternal Grandfather    Kidney disease Maternal Grandfather    Liver cancer Maternal Great-grandfather    Prostate cancer Maternal Great-grandfather    Breast cancer Maternal Great-grandmother    Diabetes Maternal Great-grandmother    Breast cancer Other        great aunt    Esophageal cancer Neg Hx    Rectal cancer Neg Hx    Colon cancer Neg Hx    Colon polyps Neg Hx    Stomach cancer Neg Hx       Objective:  Blood pressure (!) 101/57, pulse 87, height 5' 5 (1.651 m), weight 206 lb (93.4 kg), SpO2 97%. General: No acute distress.  Patient appears well-groomed.   Head:  Normocephalic/atraumatic Eyes:  Fundi examined but not  visualized Neck: supple, no paraspinal tenderness, full range of motion Heart:  Regular rate and rhythm Neurological Exam: alert and oriented.  Speech fluent and not dysarthric, language intact.  CN II-XII intact. Bulk and tone normal, muscle strength 5/5 throughout.  Sensation to light touch intact.  Deep tendon reflexes 2+ throughout, toes downgoing.  Finger to nose testing intact.  Gait normal, Romberg negative.   Juliene Dunnings, DO  CC: Mardy Baseman, NP

## 2023-12-13 ENCOUNTER — Ambulatory Visit (INDEPENDENT_AMBULATORY_CARE_PROVIDER_SITE_OTHER): Admitting: Neurology

## 2023-12-13 ENCOUNTER — Encounter: Payer: Self-pay | Admitting: Neurology

## 2023-12-13 VITALS — BP 101/57 | HR 87 | Ht 65.0 in | Wt 206.0 lb

## 2023-12-13 DIAGNOSIS — G43009 Migraine without aura, not intractable, without status migrainosus: Secondary | ICD-10-CM | POA: Diagnosis not present

## 2023-12-13 MED ORDER — NURTEC 75 MG PO TBDP: 1.0000 | ORAL_TABLET | ORAL | 5 refills | Status: AC

## 2023-12-13 NOTE — Patient Instructions (Signed)
 Start NURTEC 1 tablet every other day.  If no improvement in 3 months, contact me and we can change medication Take UBRELVY 1 table at earliest onset of migraine.  May repeat after 2 hours.  Maximum 2 tablets in 24 hours. Let me know if it works Limit use of pain relievers to no more than 9 days out of the month to prevent risk of rebound or medication-overuse headache. Keep headache diary Follow up 6 months.

## 2023-12-14 ENCOUNTER — Telehealth: Payer: Self-pay | Admitting: Pharmacist

## 2023-12-14 NOTE — Telephone Encounter (Signed)
 Pharmacy Patient Advocate Encounter   Received notification from Patient Pharmacy that prior authorization for Nurtec 75MG  dispersible tablets is required/requested.   Insurance verification completed.   The patient is insured through [facility].   Per test claim: PA required; PA submitted to above mentioned insurance via Latent Key/confirmation #/EOC [id number] Status is pending

## 2023-12-14 NOTE — Telephone Encounter (Signed)
 Pharmacy Patient Advocate Encounter  Received notification from [facility] that Prior Authorization for NURTEC 75 MG PO TBDP has been APPROVED from 12/14/2023 to 06/12/2024   PA #/Case ID/Reference #: [id number]

## 2024-01-10 ENCOUNTER — Other Ambulatory Visit (HOSPITAL_BASED_OUTPATIENT_CLINIC_OR_DEPARTMENT_OTHER): Payer: Self-pay

## 2024-02-06 ENCOUNTER — Encounter: Payer: Self-pay | Admitting: Gastroenterology

## 2024-02-15 ENCOUNTER — Other Ambulatory Visit (HOSPITAL_BASED_OUTPATIENT_CLINIC_OR_DEPARTMENT_OTHER): Payer: Self-pay

## 2024-03-06 ENCOUNTER — Inpatient Hospital Stay

## 2024-03-06 ENCOUNTER — Encounter: Payer: Self-pay | Admitting: Hematology and Oncology

## 2024-03-06 ENCOUNTER — Inpatient Hospital Stay: Attending: Hematology and Oncology | Admitting: Hematology and Oncology

## 2024-03-06 ENCOUNTER — Other Ambulatory Visit: Payer: Self-pay

## 2024-03-06 ENCOUNTER — Telehealth: Payer: Self-pay | Admitting: Hematology and Oncology

## 2024-03-06 ENCOUNTER — Other Ambulatory Visit: Payer: Self-pay | Admitting: Hematology and Oncology

## 2024-03-06 DIAGNOSIS — D691 Qualitative platelet defects: Secondary | ICD-10-CM

## 2024-03-06 LAB — CBC WITH DIFFERENTIAL (CANCER CENTER ONLY)
Abs Immature Granulocytes: 0.04 K/uL (ref 0.00–0.07)
Basophils Absolute: 0 K/uL (ref 0.0–0.1)
Basophils Relative: 0 %
Eosinophils Absolute: 0.2 K/uL (ref 0.0–0.5)
Eosinophils Relative: 2 %
HCT: 41.7 % (ref 36.0–46.0)
Hemoglobin: 13.4 g/dL (ref 12.0–15.0)
Immature Granulocytes: 0 %
Lymphocytes Relative: 29 %
Lymphs Abs: 3.1 K/uL (ref 0.7–4.0)
MCH: 27 pg (ref 26.0–34.0)
MCHC: 32.1 g/dL (ref 30.0–36.0)
MCV: 83.9 fL (ref 80.0–100.0)
Monocytes Absolute: 0.7 K/uL (ref 0.1–1.0)
Monocytes Relative: 6 %
Neutro Abs: 6.6 K/uL (ref 1.7–7.7)
Neutrophils Relative %: 63 %
Platelet Count: 399 K/uL (ref 150–400)
RBC: 4.97 MIL/uL (ref 3.87–5.11)
RDW: 13.1 % (ref 11.5–15.5)
WBC Count: 10.7 K/uL — ABNORMAL HIGH (ref 4.0–10.5)
nRBC: 0 % (ref 0.0–0.2)

## 2024-03-06 LAB — IRON AND TIBC
Iron: 74 ug/dL (ref 28–170)
Saturation Ratios: 23 % (ref 10.4–31.8)
TIBC: 322 ug/dL (ref 250–450)
UIBC: 248 ug/dL

## 2024-03-06 LAB — CMP (CANCER CENTER ONLY)
ALT: 30 U/L (ref 0–44)
AST: 22 U/L (ref 15–41)
Albumin: 4.4 g/dL (ref 3.5–5.0)
Alkaline Phosphatase: 91 U/L (ref 38–126)
Anion gap: 11 (ref 5–15)
BUN: 9 mg/dL (ref 6–20)
CO2: 23 mmol/L (ref 22–32)
Calcium: 9.7 mg/dL (ref 8.9–10.3)
Chloride: 103 mmol/L (ref 98–111)
Creatinine: 0.79 mg/dL (ref 0.44–1.00)
GFR, Estimated: >60 mL/min
Glucose, Bld: 128 mg/dL — ABNORMAL HIGH (ref 70–99)
Potassium: 4 mmol/L (ref 3.5–5.1)
Sodium: 138 mmol/L (ref 135–145)
Total Bilirubin: 0.5 mg/dL (ref 0.0–1.2)
Total Protein: 6.7 g/dL (ref 6.5–8.1)

## 2024-03-06 LAB — TSH: TSH: 1.88 u[IU]/mL (ref 0.350–4.500)

## 2024-03-06 LAB — FOLATE: Folate: >20 ng/mL

## 2024-03-06 LAB — VITAMIN B12: Vitamin B-12: 361 pg/mL (ref 180–914)

## 2024-03-06 LAB — FERRITIN: Ferritin: 93 ng/mL (ref 11–307)

## 2024-03-06 NOTE — Telephone Encounter (Signed)
 Patient has been scheduled for follow-up visit per 03/06/2024 LOS.  Pt given an appt calendar with date and time.

## 2024-03-20 ENCOUNTER — Inpatient Hospital Stay

## 2024-03-20 ENCOUNTER — Other Ambulatory Visit (HOSPITAL_COMMUNITY): Payer: Self-pay

## 2024-03-20 ENCOUNTER — Inpatient Hospital Stay: Admitting: Hematology and Oncology

## 2024-03-21 ENCOUNTER — Other Ambulatory Visit (HOSPITAL_COMMUNITY): Payer: Self-pay

## 2024-03-21 ENCOUNTER — Other Ambulatory Visit: Payer: Self-pay

## 2024-03-22 ENCOUNTER — Other Ambulatory Visit: Payer: Self-pay

## 2024-03-23 ENCOUNTER — Other Ambulatory Visit: Payer: Self-pay

## 2024-03-23 ENCOUNTER — Inpatient Hospital Stay: Admitting: Hematology and Oncology

## 2024-03-23 ENCOUNTER — Inpatient Hospital Stay

## 2024-03-23 ENCOUNTER — Telehealth: Payer: Self-pay | Admitting: Hematology and Oncology

## 2024-03-23 ENCOUNTER — Other Ambulatory Visit: Payer: Self-pay | Admitting: Hematology and Oncology

## 2024-03-23 DIAGNOSIS — D691 Qualitative platelet defects: Secondary | ICD-10-CM

## 2024-03-23 LAB — CBC WITH DIFFERENTIAL (CANCER CENTER ONLY)
Abs Immature Granulocytes: 0.04 10*3/uL (ref 0.00–0.07)
Basophils Absolute: 0 10*3/uL (ref 0.0–0.1)
Basophils Relative: 0 %
Eosinophils Absolute: 0.2 10*3/uL (ref 0.0–0.5)
Eosinophils Relative: 2 %
HCT: 40.9 % (ref 36.0–46.0)
Hemoglobin: 13.6 g/dL (ref 12.0–15.0)
Immature Granulocytes: 0 %
Lymphocytes Relative: 20 %
Lymphs Abs: 2.2 10*3/uL (ref 0.7–4.0)
MCH: 27.5 pg (ref 26.0–34.0)
MCHC: 33.3 g/dL (ref 30.0–36.0)
MCV: 82.6 fL (ref 80.0–100.0)
Monocytes Absolute: 0.9 10*3/uL (ref 0.1–1.0)
Monocytes Relative: 8 %
Neutro Abs: 7.7 10*3/uL (ref 1.7–7.7)
Neutrophils Relative %: 70 %
Platelet Count: 443 10*3/uL — ABNORMAL HIGH (ref 150–400)
RBC: 4.95 MIL/uL (ref 3.87–5.11)
RDW: 13.2 % (ref 11.5–15.5)
WBC Count: 11.1 10*3/uL — ABNORMAL HIGH (ref 4.0–10.5)
nRBC: 0 % (ref 0.0–0.2)

## 2024-03-23 NOTE — Telephone Encounter (Signed)
 Contacted pt to schedule an appt.  Wishes to call the office    Follow-Up Information  Follow-up disposition: Return in about 6 months (around 09/20/2024) for labs, visit.

## 2024-04-11 ENCOUNTER — Ambulatory Visit: Admitting: Neurology

## 2024-07-04 ENCOUNTER — Ambulatory Visit: Admitting: Neurology
